# Patient Record
Sex: Female | Born: 1980 | Race: White | Hispanic: No | Marital: Married | Smoking: Never smoker
Health system: Southern US, Community
[De-identification: ages and names within clinical notes are randomized; demographics above are authoritative.]

## PROBLEM LIST (undated history)

## (undated) DIAGNOSIS — N809 Endometriosis, unspecified: Secondary | ICD-10-CM

## (undated) HISTORY — PX: CYST EXCISION: SHX5701

## (undated) HISTORY — PX: CHOLECYSTECTOMY: SHX55

---
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
## 2006-02-15 ENCOUNTER — Other Ambulatory Visit: Admission: RE | Admit: 2006-02-15 | Discharge: 2006-02-15 | Payer: Self-pay | Admitting: Obstetrics and Gynecology

## 2009-03-18 ENCOUNTER — Ambulatory Visit (HOSPITAL_COMMUNITY): Admission: RE | Admit: 2009-03-18 | Discharge: 2009-03-18 | Payer: Self-pay | Admitting: Obstetrics and Gynecology

## 2009-03-18 ENCOUNTER — Encounter (INDEPENDENT_AMBULATORY_CARE_PROVIDER_SITE_OTHER): Payer: Self-pay | Admitting: Obstetrics and Gynecology

## 2011-03-29 LAB — CBC
MCHC: 33.9 g/dL (ref 30.0–36.0)
RDW: 12.8 % (ref 11.5–15.5)

## 2011-05-02 NOTE — Op Note (Signed)
Latasha Warren, OSTENSON              ACCOUNT NO.:  [id number]   MEDICAL RECORD NO.:  [id number]          PATIENT TYPE:  AMB   LOCATION:  SDC                           FACILITY:  [facility]   PHYSICIAN:  Huel Cote, M.D. DATE OF BIRTH:  [birth year]   DATE OF PROCEDURE:  03/18/2009  DATE OF DISCHARGE:                               OPERATIVE REPORT   PREOPERATIVE DIAGNOSES:  1. Menorrhagia.  2. Dysmenorrhea.  3. Pelvic pain.  4. Left ovarian cyst.   POSTOPERATIVE DIAGNOSES:  1. Menorrhagia.  2. Dysmenorrhea.  3. Pelvic pain.  4. Left ovarian cyst.  5. Left endometrioma.   PROCEDURES:  Laparoscopy, left ovarian cystectomy, hysteroscopy,  dilation and curettage, and Mirena placement.   SURGEON:  Huel Cote, MD   ANESTHESIA:  General.   FINDINGS:  There was a left endometrioma, which was removed and drained  measuring approximately 2-3 cm and filled with dark chocolate-colored  fluid on the posterior surface of the left ovary.  The right ovary and  tube appeared completely within normal limits.  The left tube appeared  normal.  There is a small amount of scattered endometriosis in the  pelvis otherwise, but no significant other foci which needed ablation.  Uterus appeared normal.  On hysteroscopy, the cavity itself was normal  except for a thick endometrium.   SPECIMENS:  Endometrial curettings were sent to Pathology.   FINDINGS:  Pending on that.   ESTIMATED BLOOD LOSS:  50 mL.   URINE OUTPUT:  75 mL straight cath prior to procedure.   INTRAVENOUS FLUIDS:  2000 mL LR.   PROCEDURE:  The patient was taken to operating room where general  anesthesia was obtained without difficulty.  She was then prepped and  draped in normal sterile fashion in dorsal lithotomy position.  Attention was turned to the vagina and a Hulka tenaculum placed within  the cervix without difficulty.  The speculum was then removed.  The  bladder emptied with a red rubber and then gowned in a new  set of gown  and gloves.  Attention was turned to an infraumbilical area were small  preexisting scar was incised with the scalpel approximately 2 cm in  width.  This was then elevated and the Veress needle easily introduced  into the abdominal cavity.  Intraperitoneal placement was confirmed by  aspiration injection with normal saline and pneumo-gas was applied and a  pressure of 5 noted to be normal.  Pneumoperitoneum was then obtained  with approximately 2.5-3 liters CO2 gas.  Once this was obtained, Veress  needle was removed and the 10/11 trocar OptiVu was utilized to enter  with direct visualization with the camera in place.  The peritoneal  cavity was entered and the findings as noted previously were observed.  Two additional trocars were placed 5-mm in size in the upper right and  left quadrant under direct visualization.  Each site was injected with  0.25% Marcaine plain prior to insertion of these trocars in place.  The  pelvis and abdomen were carefully inspected.  The primary finding was  the left ovarian cyst, which looked to  measure approximately 2-3 cm and  did appear to be an endometrioma.  There were some staining of the  anterior cul-de-sac, which was likely from a previously draining  endometrioma.  The left ovary was grasped with a million-dollar grasper  and elevated.  The endometrium was unfortunately posterior, which made  it a little difficult to reach.  There was a small amount of bleeding  coming from the parent hemorrhagic cyst on the ovary itself.  The  endometrioma was opened with the harmonic scalpel and a moderate amount  of chocolate-colored fluid was drained into the cul-de-sac.  The  remaining cyst wall was then cauterized with the harmonic scalpel to  remove as much as was possible.  The cyst did appear to completely  deflate and most of the cyst wall was cauterized.  The ovary itself  appeared normal except for the bleeding area from a probable  previously  hemorrhagic corpus luteum cyst.  This was controlled with the harmonic  scalpel for good hemostasis.  Once this was achieved, no active bleeding  was observed.  The abdomen and pelvis were copiously irrigated and all  of the endometrioma fluid and blood was removed from the cul-de-sac.  There was absolutely no active bleeding noted.  The right ovary and tube  as stated appeared normal.  The left tube actually appeared normal at  its fimbriated end.  Although initially, it had been slightly adherent  to the ovary, this was taken down.  The uterus appeared normal.  The  appendix appeared normal.  Thus with the left endometrioma removed and  all appearing hemostatic, the 5-mm trocars were removed under direct  visualization and the 10/11 was removed from the umbilicus after  evacuation of the pneumoperitoneum.  All sponge and instrument counts  were correct.  The trocar incisions were then closed with 1 deep suture  of 0 Vicryl at the umbilical port and a 3-0 Vicryl subcuticular stitch.  The 5-mm ports were controlled with a 3-0 Vicryl subcuticular stitch and  Dermabond on all 3 as well.  All appeared hemostatic.  Therefore,  attention was then turned vaginally.  The Hulka tenaculum had come  undone during the laparoscopic procedure, so was already removed from  the vagina.  The speculum was reinserted.  The cervix identified and  grasped on the anterior lip after injection with 1% plain lidocaine with  a single-tooth tenaculum.  An additional paracervical block was  performed with approximately 20 mL of 1% lidocaine at the 2 and 10  o'clock position.  The uterus was easily sounded to approximately 7-8 cm  and sequential dilation was easily performed due to her prior Cytotec  placement.  With this performed, the camera was then introduced into the  uterine cavity.  It appeared normal with no submucosal fibroids or  polyps noted.  However, there was abundant thick endometrium  noted.  At  this point, the camera was removed and a curettage performed with  abundant endometrium removed and sent to Pathology.  Once this was  completed, the hysteroscope was reintroduced into the cavity which  appeared normal and was slightly more easy to visualize with the  thickened endometrium removed.  There was no active bleeding noted.  Therefore, the hysteroscope was removed and the Mirena IUD placed  without difficulty.  Strings were trimmed to about 2-3 cm and then the  tenaculum was removed from the cervix.  There was a small area of  bleeding at the tenaculum site which was  treated with silver nitrate and  this was hemostatic at the conclusion of the case.  Again, all sponge,  lap, and needle counts were correct x2 and the patient was awakened and  taken to the recovery room in good condition.      Huel Cote, M.D.  Electronically Signed    KR/MEDQ  D:  03/18/2009  T:  03/18/2009  Job:  [id number]

## 2011-05-02 NOTE — H&P (Signed)
Latasha Warren, Latasha Warren              ACCOUNT NO.:  [id number]   MEDICAL RECORD NO.:  [id number]          PATIENT TYPE:  AMB   LOCATION:  SDC                           FACILITY:  [facility]   PHYSICIAN:  Huel Cote, M.D. DATE OF BIRTH:  [birth year]   DATE OF ADMISSION:  DATE OF DISCHARGE:                              HISTORY & PHYSICAL   The patient is a 30 year old nulligravida female who is coming in for a  scheduled laparoscopy and hysteroscopy given an ongoing problem with  abnormal uterine bleeding and a persistent ovarian cyst on the left  which measures approximately 2-3 cm and could be an endometrioma versus  a dermoid.  The patient complains of bleeding approximately 2 weeks out  of 4 and also complains of terrible pain with her cycles, about 50% of  the time with heavy bleeding.  She also reports no sexual activity with  her husband in greater than 3 years secondary to the pain with her  cycles and decrease in enjoyment associated with these issues.  She had  a ultrasound performed to evaluate her endometrium and at that point was  also noted to have the left ovarian cyst which was 2 cm and 3 cm of  size.  This had persisted over approximately 3 months of scanning on the  followup.   PAST MEDICAL HISTORY:  Insignificant.   PAST SURGICAL HISTORY:  Significant for cholecystectomy only.   PAST OBSTETRICAL HISTORY:  None.   PAST GYN HISTORY:  No abnormal Pap smears.   MEDICATIONS:  She currently takes no medications.   ALLERGIES:  ERYTHROMYCIN only.   PHYSICAL EXAMINATION:  VITAL SIGNS:  The patient's height is 5 feet 7.  Her weight is 133.  Blood pressure 120/76.  CARDIAC:  Regular rate and rhythm.  LUNGS:  Clear.  ABDOMEN:  Soft and nontender.  PELVIC:  She has a normal vagina externally.  Cervix has no lesions.  Uterus is normal in size and contour and the adnexa have no palpable  masses felt bilaterally.   We had discussed all possible options and given her  persistent abnormal  bleeding, really felt that she should have endometrial sampling.  This  had been attempted in the past and unable to do secondary to her  stenotic cervix.  We also discussed the persistence of the left ovary  with a possible endometrioma or dermoid and, given these issues, the  patient wished to proceed with definitive surgery.  We therefore  discussed laparoscopy in detail as well as hysteroscopy in detail.  The  risks of laparoscopy were addressed including bleeding, infection and  possible damage to adjacent organs including bowel or bladder.  She  understands she would need a larger abdominal incision should any of  these complications arise and have a delay in recovery.  We also  discussed hysteroscopy in detail with a risk of uterine perforation.  We  will perform endometrial sampling at the time and also have placement of  a Mirena IUD to thin her lining and decrease her cycles in the long run.  She understands she will have abnormal bleeding  in the beginning with  the Mirena in place; however, this will hopefully abate as months go on  and she is also glad to have  this as contraception.  Therefore after all risks and benefits were  discussed with the patient in detail, we decided to proceed with her D  and C, hysteroscopy and laparoscopy and Mirena placement.  She will use  Cytotec 3 hours prior to procedure to aid with cervical dilatation given  her history of a stenotic cervix.      Huel Cote, M.D.  Electronically Signed     KR/MEDQ  D:  03/17/2009  T:  03/17/2009  Job:  [id number]

## 2020-09-28 ENCOUNTER — Inpatient Hospital Stay (HOSPITAL_COMMUNITY): Payer: No Typology Code available for payment source

## 2020-09-28 ENCOUNTER — Encounter (HOSPITAL_COMMUNITY): Payer: Self-pay

## 2020-09-28 ENCOUNTER — Other Ambulatory Visit: Payer: Self-pay

## 2020-09-28 ENCOUNTER — Inpatient Hospital Stay (HOSPITAL_COMMUNITY)
Admission: EM | Admit: 2020-09-28 | Discharge: 2020-09-30 | DRG: 494 | Disposition: A | Discharge status: Home / Self Care | Payer: No Typology Code available for payment source | Attending: Student | Admitting: Student

## 2020-09-28 ENCOUNTER — Emergency Department (HOSPITAL_COMMUNITY): Payer: No Typology Code available for payment source

## 2020-09-28 DIAGNOSIS — T148XXA Other injury of unspecified body region, initial encounter: Secondary | ICD-10-CM

## 2020-09-28 DIAGNOSIS — S82252A Displaced comminuted fracture of shaft of left tibia, initial encounter for closed fracture: Principal | ICD-10-CM

## 2020-09-28 DIAGNOSIS — Z419 Encounter for procedure for purposes other than remedying health state, unspecified: Secondary | ICD-10-CM

## 2020-09-28 DIAGNOSIS — S82872A Displaced pilon fracture of left tibia, initial encounter for closed fracture: Secondary | ICD-10-CM

## 2020-09-28 DIAGNOSIS — S82202A Unspecified fracture of shaft of left tibia, initial encounter for closed fracture: Secondary | ICD-10-CM | POA: Diagnosis present

## 2020-09-28 DIAGNOSIS — S82832A Other fracture of upper and lower end of left fibula, initial encounter for closed fracture: Secondary | ICD-10-CM

## 2020-09-28 DIAGNOSIS — W010XXA Fall on same level from slipping, tripping and stumbling without subsequent striking against object, initial encounter: Secondary | ICD-10-CM | POA: Diagnosis present

## 2020-09-28 DIAGNOSIS — Z881 Allergy status to other antibiotic agents status: Secondary | ICD-10-CM | POA: Diagnosis not present

## 2020-09-28 DIAGNOSIS — Y92219 Unspecified school as the place of occurrence of the external cause: Secondary | ICD-10-CM

## 2020-09-28 DIAGNOSIS — Z20822 Contact with and (suspected) exposure to covid-19: Secondary | ICD-10-CM | POA: Diagnosis present

## 2020-09-28 HISTORY — DX: Endometriosis, unspecified: N80.9

## 2020-09-28 LAB — CBC WITH DIFFERENTIAL/PLATELET
Abs Immature Granulocytes: 0.05 10*3/uL (ref 0.00–0.07)
Basophils Absolute: 0 10*3/uL (ref 0.0–0.1)
Basophils Relative: 1 %
Eosinophils Absolute: 0.1 10*3/uL (ref 0.0–0.5)
Eosinophils Relative: 1 %
HCT: 39.2 % (ref 36.0–46.0)
Hemoglobin: 12.3 g/dL (ref 12.0–15.0)
Immature Granulocytes: 1 %
Lymphocytes Relative: 15 %
Lymphs Abs: 1.3 10*3/uL (ref 0.7–4.0)
MCH: 29.2 pg (ref 26.0–34.0)
MCHC: 31.4 g/dL (ref 30.0–36.0)
MCV: 93.1 fL (ref 80.0–100.0)
Monocytes Absolute: 0.6 10*3/uL (ref 0.1–1.0)
Monocytes Relative: 6 %
Neutro Abs: 6.8 10*3/uL (ref 1.7–7.7)
Neutrophils Relative %: 76 %
Platelets: 275 10*3/uL (ref 150–400)
RBC: 4.21 MIL/uL (ref 3.87–5.11)
RDW: 12.8 % (ref 11.5–15.5)
WBC: 8.8 10*3/uL (ref 4.0–10.5)
nRBC: 0 % (ref 0.0–0.2)

## 2020-09-28 LAB — COMPREHENSIVE METABOLIC PANEL
ALT: 16 U/L (ref 0–44)
AST: 24 U/L (ref 15–41)
Albumin: 4 g/dL (ref 3.5–5.0)
Alkaline Phosphatase: 35 U/L — ABNORMAL LOW (ref 38–126)
Anion gap: 11 (ref 5–15)
BUN: 12 mg/dL (ref 6–20)
CO2: 23 mmol/L (ref 22–32)
Calcium: 8.8 mg/dL — ABNORMAL LOW (ref 8.9–10.3)
Chloride: 103 mmol/L (ref 98–111)
Creatinine, Ser: 0.84 mg/dL (ref 0.44–1.00)
GFR, Estimated: >60 mL/min (ref >60)
Glucose, Bld: 128 mg/dL — ABNORMAL HIGH (ref 70–99)
Potassium: 3.6 mmol/L (ref 3.5–5.1)
Sodium: 137 mmol/L (ref 135–145)
Total Bilirubin: 0.6 mg/dL (ref 0.3–1.2)
Total Protein: 6.5 g/dL (ref 6.5–8.1)

## 2020-09-28 LAB — RESPIRATORY PANEL BY RT PCR (FLU A&B, COVID)
Influenza A by PCR: NEGATIVE
Influenza B by PCR: NEGATIVE
SARS Coronavirus 2 by RT PCR: NEGATIVE

## 2020-09-28 MED ORDER — ONDANSETRON HCL 4 MG/2ML IJ SOLN: 4.0000 mg | Freq: Four times a day (QID) | INTRAMUSCULAR | Status: DC | PRN

## 2020-09-28 MED ORDER — MORPHINE SULFATE (PF) 2 MG/ML IV SOLN
2.0000 mg | INTRAVENOUS | Status: DC | PRN
  Administered 2020-09-28: 2 mg via INTRAVENOUS
  Filled 2020-09-28: qty 1

## 2020-09-28 MED ORDER — MORPHINE SULFATE (PF) 4 MG/ML IV SOLN
4.0000 mg | Freq: Once | INTRAVENOUS | Status: AC
  Administered 2020-09-28: 4 mg via INTRAVENOUS
  Filled 2020-09-28: qty 1

## 2020-09-28 MED ORDER — ENOXAPARIN SODIUM 40 MG/0.4ML ~~LOC~~ SOLN: 40.0000 mg | SUBCUTANEOUS | Status: DC

## 2020-09-28 MED ORDER — OXYCODONE HCL 5 MG PO TABS
5.0000 mg | ORAL_TABLET | ORAL | Status: DC | PRN
  Administered 2020-09-28 – 2020-09-29 (×3): 15 mg via ORAL
  Administered 2020-09-29: 10 mg via ORAL
  Administered 2020-09-30: 15 mg via ORAL
  Administered 2020-09-30: 10 mg via ORAL
  Filled 2020-09-28: qty 3
  Filled 2020-09-28: qty 2
  Filled 2020-09-28 (×2): qty 3
  Filled 2020-09-28: qty 2
  Filled 2020-09-28: qty 3

## 2020-09-28 MED ORDER — METHOCARBAMOL 500 MG PO TABS
500.0000 mg | ORAL_TABLET | Freq: Four times a day (QID) | ORAL | Status: DC | PRN
  Administered 2020-09-28 – 2020-09-30 (×5): 500 mg via ORAL
  Filled 2020-09-28 (×4): qty 1

## 2020-09-28 MED ORDER — HYDROMORPHONE HCL 1 MG/ML IJ SOLN
0.5000 mg | Freq: Once | INTRAMUSCULAR | Status: AC
  Administered 2020-09-28: 0.5 mg via INTRAVENOUS
  Filled 2020-09-28: qty 1

## 2020-09-28 MED ORDER — ONDANSETRON HCL 4 MG PO TABS: 4.0000 mg | ORAL_TABLET | Freq: Four times a day (QID) | ORAL | Status: DC | PRN

## 2020-09-28 MED ORDER — METHOCARBAMOL 1000 MG/10ML IJ SOLN
500.0000 mg | Freq: Four times a day (QID) | INTRAVENOUS | Status: DC | PRN
  Filled 2020-09-28 (×3): qty 5

## 2020-09-28 NOTE — ED Provider Notes (Signed)
[facility] EMERGENCY DEPARTMENT Provider Note   CSN: [id number] Arrival date & time: 09/28/20  1359     History Chief Complaint  Patient presents with  . Leg Injury    Latasha Warren is a 39 y.o. female.  HPI Patient is a 39 year old female with no pertinent past medical history other than elective cholecystectomy several years ago.  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  She states that she had severe onset of 10/10 pain that is achy severe radiating into her lower shin.  She denies any other areas of pain.  Denies any buttocks, hip or back pain.  She denies any head injury or loss of consciousness.  She states she otherwise feels well.  Denies any lightheadedness dizziness, chest pain abdominal pain, headache.  States pain is intimately worse with movement and touch.  She states she can feel well in her toes however and is able to move them.  She is been holding still for the most part.  No other aggravating or mitigating factors.  She is taken nothing for pain prior to arrival in the ER.     History reviewed. No pertinent past medical history.  There are no problems to display for this patient.   Past Surgical History:  Procedure Laterality Date  . CHOLECYSTECTOMY    . CYST EXCISION       OB History   No obstetric history on file.     History reviewed. No pertinent family history.  Social History   Tobacco Use  . Smoking status: Never Smoker  Substance Use Topics  . Alcohol use: Yes    Comment: beer and whiskey on weekends.   . Drug use: Never    Home Medications Prior to Admission medications   Not on File    Allergies    Erythromycin  Review of Systems   Review of Systems  Constitutional: Negative for fever.  HENT: Negative for congestion.   Respiratory: Negative for shortness of breath.     Cardiovascular: Negative for chest pain.  Gastrointestinal: Negative for abdominal distention.  Musculoskeletal:       Left lower leg pain  Neurological: Negative for dizziness and headaches.    Physical Exam Updated Vital Signs BP (!) 146/100   Pulse 92   Temp 98.6 F (37 C) (Oral)   Resp 18   LMP 08/29/2020   SpO2 100%   Physical Exam Vitals and nursing note reviewed.  Constitutional:      General: She is in acute distress (Uncomfortable).     Appearance: Normal appearance. She is not ill-appearing.     Comments: Uncomfortable  HENT:     Head: Normocephalic and atraumatic.     Mouth/Throat:     Mouth: Mucous membranes are moist.  Eyes:     General: No scleral icterus.       Right eye: No discharge.        Left eye: No discharge.     Conjunctiva/sclera: Conjunctivae normal.  Pulmonary:     Effort: Pulmonary effort is normal.     Breath sounds: No stridor.  Abdominal:     Tenderness: There is no abdominal tenderness.  Musculoskeletal:     Comments: Significant deformity of the left mid shin with swelling no significant bruising no lacerations or deep cuts.  No significant abrasions either.  There is significant tenderness with light touch to the midshin.  Some tenderness of the lateral malleolus as well.  Able to wiggle toes.  No tenderness to palpation of the foot, metatarsals, toes, phalanxes, heel.  No hip or pelvic tenderness to palpation no abdominal tenderness.  No midline back tenderness to palpation.  Skin:    General: Skin is warm and dry.     Capillary Refill: Capillary refill takes less than 2 seconds. Cap refill in all toes of left foot less than 2 seconds Neurological:     Mental Status: She is alert and oriented to person, place, and time. Mental status is at baseline.     Comments: Sensation intact in all toes in all aspects of the leg.  Psychiatric:        Mood and Affect: Mood normal.        Behavior: Behavior normal.     ED Results /  Procedures / Treatments   Labs (all labs ordered are listed, but only abnormal results are displayed) Labs Reviewed  RESPIRATORY PANEL BY RT PCR (FLU A&B, COVID)  CBC WITH DIFFERENTIAL/PLATELET  COMPREHENSIVE METABOLIC PANEL    EKG None  Radiology DG Tibia/Fibula Left  Result Date: 09/28/2020 CLINICAL DATA:  Fall, leg pain and swelling EXAM: LEFT TIBIA AND FIBULA - 2 VIEW COMPARISON:  None. FINDINGS: Acute minimally comminuted fracture of the mid to distal left tibial diaphysis. Slight posterior displacement and anterior apex angulation. Comminuted predominantly obliquely oriented fracture of the distal fibular metaphysis with approximately 7 mm of posterior displacement. There is soft tissue swelling at the fracture sites. Alignment at the knee and ankle appear anatomic. IMPRESSION: Acute comminuted mildly displaced fractures of the distal left tibial diaphysis and distal fibular metaphysis. Electronically Signed   By: Duanne Guess D.O.   On: 09/28/2020 14:58    Procedures Procedures (including critical care time)  Medications Ordered in ED Medications  morphine 4 MG/ML injection 4 mg (4 mg Intravenous Given 09/28/20 1527)    ED Course  I have reviewed the triage vital signs and the nursing notes.  Pertinent labs & imaging results that were available during my care of the patient were reviewed by me and considered in my medical decision making (see chart for details).  Clinical Course as of Sep 28 1552  Tue Sep 28, 2020  1548 Patient assessed by PA Lin Givens of orthopedic surgery.  Will be admitted to orthopedic service.  I placed orders for basic lab work and Covid swab.   [WF]    Clinical Course User Index [WF] Gailen Shelter, [place]   MDM Rules/Calculators/A&P                          Patient is 39 year old healthy female with no pertinent past medical history and on no medications presented today with left leg pain found to have a tib-fib fracture.  She has good  pulses sensation and good cap refill distally.  She does have significant tenderness to palpation consistent for fractures low suspicion the patient will develop compartment syndrome.  She is in significant pain but is handling pain well.  We will provide patient with morphine and discussed with orthopedics to determine whether she will need admission for surgery.  Patient mated to orthopedic service.  Basic labs and Covid swab obtained for anesthesia prior to surgery.  Surgery will occur tomorrow per [facility].  Final Clinical Impression(s) / ED Diagnoses Final diagnoses:  Closed displaced comminuted  fracture of shaft of left tibia, initial encounter  Closed fracture of distal end of left fibula, unspecified fracture morphology, initial encounter    Rx / DC Orders ED Discharge Orders    None       Gailen Shelter, [place] 09/28/20 1554    Vanetta Mulders, MD 09/30/20 [phone]

## 2020-09-28 NOTE — Plan of Care (Signed)

## 2020-09-28 NOTE — H&P (View-Only) (Signed)
Reason for Consult:Left tibia fx Referring Physician: Darlyn Chamber  Latasha Warren is an 39 y.o. female.  HPI: Latasha Warren was walking outside at school and slipped on something. Latasha Warren fell backwards and hurt Latasha Warren leg. Latasha Warren could not bear weight on it. Latasha Warren was brought to the ED where x-rays showed a tib/fib fx and orthopedic surgery was consulted. Latasha Warren works as a Runner, broadcasting/film/video.  History reviewed. No pertinent past medical history.  Past Surgical History:  Procedure Laterality Date  . CHOLECYSTECTOMY    . CYST EXCISION      History reviewed. No pertinent family history.  Social History:  reports that Latasha Warren has never smoked. Latasha Warren does not have any smokeless tobacco history on file. Latasha Warren reports current alcohol use. Latasha Warren reports that Latasha Warren does not use drugs.  Allergies:  Allergies  Allergen Reactions  . Erythromycin Hives    Medications: I have reviewed the patient's current medications.  No results found for this or any previous visit (from the past 48 hour(s)).  DG Tibia/Fibula Left  Result Date: 09/28/2020 CLINICAL DATA:  Fall, leg pain and swelling EXAM: LEFT TIBIA AND FIBULA - 2 VIEW COMPARISON:  None. FINDINGS: Acute minimally comminuted fracture of the mid to distal left tibial diaphysis. Slight posterior displacement and anterior apex angulation. Comminuted predominantly obliquely oriented fracture of the distal fibular metaphysis with approximately 7 mm of posterior displacement. There is soft tissue swelling at the fracture sites. Alignment at the knee and ankle appear anatomic. IMPRESSION: Acute comminuted mildly displaced fractures of the distal left tibial diaphysis and distal fibular metaphysis. Electronically Signed   By: Duanne Guess D.O.   On: 09/28/2020 14:58    Review of Systems  HENT: Negative for ear discharge, ear pain, hearing loss and tinnitus.   Eyes: Negative for photophobia and pain.  Respiratory: Negative for cough and shortness of breath.   Cardiovascular: Negative for  chest pain.  Gastrointestinal: Negative for abdominal pain, nausea and vomiting.  Genitourinary: Negative for dysuria, flank pain, frequency and urgency.  Musculoskeletal: Positive for arthralgias (Left leg). Negative for back pain, myalgias and neck pain.  Neurological: Negative for dizziness and headaches.  Hematological: Does not bruise/bleed easily.  Psychiatric/Behavioral: The patient is not nervous/anxious.    Blood pressure (!) 146/100, pulse 92, temperature 98.6 F (37 C), temperature source Oral, resp. rate 18, last menstrual period 08/29/2020, SpO2 100 %. Physical Exam Constitutional:      General: Latasha Warren is not in acute distress.    Appearance: Latasha Warren is well-developed. Latasha Warren is not diaphoretic.  HENT:     Head: Normocephalic and atraumatic.  Eyes:     General: No scleral icterus.       Right eye: No discharge.        Left eye: No discharge.     Conjunctiva/sclera: Conjunctivae normal.  Cardiovascular:     Rate and Rhythm: Normal rate and regular rhythm.  Pulmonary:     Effort: Pulmonary effort is normal. No respiratory distress.  Musculoskeletal:     Cervical back: Normal range of motion.     Comments: LLE No traumatic wounds or rash  TTP lower leg/ankle  No knee effusion  Knee stable to varus/ valgus and anterior/posterior stress  Sens DPN, SPN, TN intact  Motor EHL, ext, flex, evers 5/5  DP 1+, PT 1+, No significant edema  Skin:    General: Skin is warm and dry.  Neurological:     Mental Status: Latasha Warren is alert.  Psychiatric:  Behavior: Behavior normal.     Assessment/Plan: Left tib/fib fx -- Plan IMN, EUA tomorrow by Dr. Haddix. NPO after MN.    Cassundra Mckeever J. Joliene Salvador, PA-C Orthopedic Surgery [phone] 09/28/2020, 3:47 PM  

## 2020-09-28 NOTE — ED Triage Notes (Signed)
To triage via EMS.  Pt works at school, was walking outside during Loss adjuster, chartered, slipped on acorn, fell on buttocks.  Obvious deformity to left lower leg in two places.   + pedal pulses.

## 2020-09-28 NOTE — Progress Notes (Signed)
Orthopedic Tech Progress Note Patient Details:  Latasha Warren [birth year] [id number] Applied a SHORT LEG with STIRRUPS. Splint could have been a little better but patient was in a lot of discomfort with the bones moving around.Marland Kitchen after applying splint myself and others helped patient on stretcher  Ortho Devices Type of Ortho Device: Stirrup splint, Short leg splint Ortho Device/Splint Location: LLE Ortho Device/Splint Interventions: Ordered, Application   Post Interventions Patient Tolerated: Fair, Well Instructions Provided: Care of device   Donald Pore 09/28/2020, 5:39 PM

## 2020-09-28 NOTE — Consult Note (Signed)
Reason for Consult:Left tibia fx Referring Physician: Darlyn Chamber  Latasha Warren is an 39 y.o. female.  HPI: Latasha Warren was walking outside at school and slipped on something. She fell backwards and hurt her leg. She could not bear weight on it. She was brought to the ED where x-rays showed a tib/fib fx and orthopedic surgery was consulted. She works as a Runner, broadcasting/film/video.  History reviewed. No pertinent past medical history.  Past Surgical History:  Procedure Laterality Date  . CHOLECYSTECTOMY    . CYST EXCISION      History reviewed. No pertinent family history.  Social History:  reports that she has never smoked. She does not have any smokeless tobacco history on file. She reports current alcohol use. She reports that she does not use drugs.  Allergies:  Allergies  Allergen Reactions  . Erythromycin Hives    Medications: I have reviewed the patient's current medications.  No results found for this or any previous visit (from the past 48 hour(s)).  DG Tibia/Fibula Left  Result Date: 09/28/2020 CLINICAL DATA:  Fall, leg pain and swelling EXAM: LEFT TIBIA AND FIBULA - 2 VIEW COMPARISON:  None. FINDINGS: Acute minimally comminuted fracture of the mid to distal left tibial diaphysis. Slight posterior displacement and anterior apex angulation. Comminuted predominantly obliquely oriented fracture of the distal fibular metaphysis with approximately 7 mm of posterior displacement. There is soft tissue swelling at the fracture sites. Alignment at the knee and ankle appear anatomic. IMPRESSION: Acute comminuted mildly displaced fractures of the distal left tibial diaphysis and distal fibular metaphysis. Electronically Signed   By: Duanne Guess D.O.   On: 09/28/2020 14:58    Review of Systems  HENT: Negative for ear discharge, ear pain, hearing loss and tinnitus.   Eyes: Negative for photophobia and pain.  Respiratory: Negative for cough and shortness of breath.   Cardiovascular: Negative for  chest pain.  Gastrointestinal: Negative for abdominal pain, nausea and vomiting.  Genitourinary: Negative for dysuria, flank pain, frequency and urgency.  Musculoskeletal: Positive for arthralgias (Left leg). Negative for back pain, myalgias and neck pain.  Neurological: Negative for dizziness and headaches.  Hematological: Does not bruise/bleed easily.  Psychiatric/Behavioral: The patient is not nervous/anxious.    Blood pressure (!) 146/100, pulse 92, temperature 98.6 F (37 C), temperature source Oral, resp. rate 18, last menstrual period 08/29/2020, SpO2 100 %. Physical Exam Constitutional:      General: She is not in acute distress.    Appearance: She is well-developed. She is not diaphoretic.  HENT:     Head: Normocephalic and atraumatic.  Eyes:     General: No scleral icterus.       Right eye: No discharge.        Left eye: No discharge.     Conjunctiva/sclera: Conjunctivae normal.  Cardiovascular:     Rate and Rhythm: Normal rate and regular rhythm.  Pulmonary:     Effort: Pulmonary effort is normal. No respiratory distress.  Musculoskeletal:     Cervical back: Normal range of motion.     Comments: LLE No traumatic wounds or rash  TTP lower leg/ankle  No knee effusion  Knee stable to varus/ valgus and anterior/posterior stress  Sens DPN, SPN, TN intact  Motor EHL, ext, flex, evers 5/5  DP 1+, PT 1+, No significant edema  Skin:    General: Skin is warm and dry.  Neurological:     Mental Status: She is alert.  Psychiatric:  Behavior: Behavior normal.     Assessment/Plan: Left tib/fib fx -- Plan IMN, EUA tomorrow by Dr. Jena Gauss. NPO after MN.    Freeman Caldron, PA-C Orthopedic Surgery [phone] 09/28/2020, 3:47 PM

## 2020-09-29 ENCOUNTER — Inpatient Hospital Stay (HOSPITAL_COMMUNITY): Payer: No Typology Code available for payment source | Admitting: Anesthesiology

## 2020-09-29 ENCOUNTER — Inpatient Hospital Stay (HOSPITAL_COMMUNITY): Payer: No Typology Code available for payment source

## 2020-09-29 ENCOUNTER — Encounter (HOSPITAL_COMMUNITY)
Admission: EM | Disposition: A | Discharge status: Home / Self Care | Payer: Self-pay | Source: Home / Self Care | Attending: Student

## 2020-09-29 HISTORY — PX: TIBIA IM NAIL INSERTION: SHX2516

## 2020-09-29 LAB — SURGICAL PCR SCREEN
MRSA, PCR: NEGATIVE
Staphylococcus aureus: NEGATIVE

## 2020-09-29 LAB — VITAMIN D 25 HYDROXY (VIT D DEFICIENCY, FRACTURES): Vit D, 25-Hydroxy: 46.1 ng/mL (ref 30–100)

## 2020-09-29 LAB — HIV ANTIBODY (ROUTINE TESTING W REFLEX): HIV Screen 4th Generation wRfx: NONREACTIVE

## 2020-09-29 SURGERY — INSERTION, INTRAMEDULLARY ROD, TIBIA
Anesthesia: General | Laterality: Left

## 2020-09-29 MED ORDER — MIDAZOLAM HCL 2 MG/2ML IJ SOLN
INTRAMUSCULAR | Status: AC
  Filled 2020-09-29: qty 2

## 2020-09-29 MED ORDER — POLYETHYLENE GLYCOL 3350 17 G PO PACK: 17.0000 g | PACK | Freq: Every day | ORAL | Status: DC | PRN

## 2020-09-29 MED ORDER — DEXMEDETOMIDINE (PRECEDEX) IN NS 20 MCG/5ML (4 MCG/ML) IV SYRINGE
PREFILLED_SYRINGE | INTRAVENOUS | Status: DC | PRN
  Administered 2020-09-29: 8 ug via INTRAVENOUS
  Administered 2020-09-29: 4 ug via INTRAVENOUS
  Administered 2020-09-29: 8 ug via INTRAVENOUS

## 2020-09-29 MED ORDER — FENTANYL CITRATE (PF) 250 MCG/5ML IJ SOLN
INTRAMUSCULAR | Status: AC
  Filled 2020-09-29: qty 5

## 2020-09-29 MED ORDER — PROPOFOL 10 MG/ML IV BOLUS
INTRAVENOUS | Status: AC
  Filled 2020-09-29: qty 40

## 2020-09-29 MED ORDER — FENTANYL CITRATE (PF) 100 MCG/2ML IJ SOLN
INTRAMUSCULAR | Status: AC
  Filled 2020-09-29: qty 2

## 2020-09-29 MED ORDER — ROCURONIUM BROMIDE 10 MG/ML (PF) SYRINGE
PREFILLED_SYRINGE | INTRAVENOUS | Status: DC | PRN
  Administered 2020-09-29: 50 mg via INTRAVENOUS

## 2020-09-29 MED ORDER — ONDANSETRON HCL 4 MG/2ML IJ SOLN: 4.0000 mg | Freq: Four times a day (QID) | INTRAMUSCULAR | Status: DC | PRN

## 2020-09-29 MED ORDER — HYDROMORPHONE HCL 1 MG/ML IJ SOLN
INTRAMUSCULAR | Status: DC | PRN
  Administered 2020-09-29 (×2): .5 mg via INTRAVENOUS

## 2020-09-29 MED ORDER — HYDROMORPHONE HCL 1 MG/ML IJ SOLN
INTRAMUSCULAR | Status: AC
  Filled 2020-09-29: qty 0.5

## 2020-09-29 MED ORDER — POTASSIUM CHLORIDE IN NACL 20-0.9 MEQ/L-% IV SOLN
INTRAVENOUS | Status: DC
  Filled 2020-09-29: qty 1000

## 2020-09-29 MED ORDER — LIDOCAINE 2% (20 MG/ML) 5 ML SYRINGE
INTRAMUSCULAR | Status: AC
  Filled 2020-09-29: qty 10

## 2020-09-29 MED ORDER — DEXMEDETOMIDINE HCL IN NACL 80 MCG/20ML IV SOLN
INTRAVENOUS | Status: AC
  Filled 2020-09-29: qty 20

## 2020-09-29 MED ORDER — ACETAMINOPHEN 325 MG PO TABS
650.0000 mg | ORAL_TABLET | Freq: Four times a day (QID) | ORAL | Status: DC
  Administered 2020-09-29 – 2020-09-30 (×6): 650 mg via ORAL
  Filled 2020-09-29 (×6): qty 2

## 2020-09-29 MED ORDER — DEXMEDETOMIDINE HCL 200 MCG/2ML IV SOLN
40.0000 ug | Freq: Once | INTRAVENOUS | Status: AC
  Administered 2020-09-29: 40 ug via INTRAVENOUS

## 2020-09-29 MED ORDER — LIDOCAINE 2% (20 MG/ML) 5 ML SYRINGE
INTRAMUSCULAR | Status: DC | PRN
  Administered 2020-09-29: 40 mg via INTRAVENOUS

## 2020-09-29 MED ORDER — DOCUSATE SODIUM 100 MG PO CAPS
100.0000 mg | ORAL_CAPSULE | Freq: Two times a day (BID) | ORAL | Status: DC
  Administered 2020-09-29 – 2020-09-30 (×3): 100 mg via ORAL
  Filled 2020-09-29 (×3): qty 1

## 2020-09-29 MED ORDER — VANCOMYCIN HCL 1000 MG IV SOLR
INTRAVENOUS | Status: AC
  Filled 2020-09-29: qty 1000

## 2020-09-29 MED ORDER — FENTANYL CITRATE (PF) 100 MCG/2ML IJ SOLN
25.0000 ug | INTRAMUSCULAR | Status: DC | PRN
  Administered 2020-09-29 (×2): 50 ug via INTRAVENOUS

## 2020-09-29 MED ORDER — SUGAMMADEX SODIUM 200 MG/2ML IV SOLN
INTRAVENOUS | Status: DC | PRN
  Administered 2020-09-29: 200 mg via INTRAVENOUS

## 2020-09-29 MED ORDER — ROCURONIUM BROMIDE 10 MG/ML (PF) SYRINGE
PREFILLED_SYRINGE | INTRAVENOUS | Status: AC
  Filled 2020-09-29: qty 10

## 2020-09-29 MED ORDER — POVIDONE-IODINE 10 % EX SWAB: 2.0000 "application " | Freq: Once | CUTANEOUS | Status: DC

## 2020-09-29 MED ORDER — METOCLOPRAMIDE HCL 5 MG PO TABS: 5.0000 mg | ORAL_TABLET | Freq: Three times a day (TID) | ORAL | Status: DC | PRN

## 2020-09-29 MED ORDER — ONDANSETRON HCL 4 MG/2ML IJ SOLN
INTRAMUSCULAR | Status: DC | PRN
  Administered 2020-09-29: 4 mg via INTRAVENOUS

## 2020-09-29 MED ORDER — METOCLOPRAMIDE HCL 5 MG/ML IJ SOLN: 5.0000 mg | Freq: Three times a day (TID) | INTRAMUSCULAR | Status: DC | PRN

## 2020-09-29 MED ORDER — CEFAZOLIN SODIUM-DEXTROSE 2-4 GM/100ML-% IV SOLN
2.0000 g | INTRAVENOUS | Status: AC
  Administered 2020-09-29: 2 g via INTRAVENOUS

## 2020-09-29 MED ORDER — ENOXAPARIN SODIUM 40 MG/0.4ML ~~LOC~~ SOLN
40.0000 mg | SUBCUTANEOUS | Status: DC
  Administered 2020-09-30: 40 mg via SUBCUTANEOUS
  Filled 2020-09-29: qty 0.4

## 2020-09-29 MED ORDER — KETOROLAC TROMETHAMINE 30 MG/ML IJ SOLN
30.0000 mg | Freq: Once | INTRAMUSCULAR | Status: AC
  Administered 2020-09-29: 30 mg via INTRAVENOUS

## 2020-09-29 MED ORDER — VANCOMYCIN HCL 1000 MG IV SOLR
INTRAVENOUS | Status: DC | PRN
  Administered 2020-09-29: 1000 mg

## 2020-09-29 MED ORDER — ONDANSETRON HCL 4 MG/2ML IJ SOLN
INTRAMUSCULAR | Status: AC
  Filled 2020-09-29: qty 2

## 2020-09-29 MED ORDER — CEFAZOLIN SODIUM-DEXTROSE 2-4 GM/100ML-% IV SOLN
2.0000 g | Freq: Three times a day (TID) | INTRAVENOUS | Status: AC
  Administered 2020-09-29 – 2020-09-30 (×3): 2 g via INTRAVENOUS
  Filled 2020-09-29 (×3): qty 100

## 2020-09-29 MED ORDER — MIDAZOLAM HCL 5 MG/5ML IJ SOLN
INTRAMUSCULAR | Status: DC | PRN
  Administered 2020-09-29: 2 mg via INTRAVENOUS

## 2020-09-29 MED ORDER — ONDANSETRON HCL 4 MG/2ML IJ SOLN: 4.0000 mg | Freq: Once | INTRAMUSCULAR | Status: DC | PRN

## 2020-09-29 MED ORDER — METHOCARBAMOL 500 MG PO TABS
ORAL_TABLET | ORAL | Status: AC
  Filled 2020-09-29: qty 1

## 2020-09-29 MED ORDER — DEXAMETHASONE SODIUM PHOSPHATE 10 MG/ML IJ SOLN
INTRAMUSCULAR | Status: DC | PRN
  Administered 2020-09-29: 10 mg via INTRAVENOUS

## 2020-09-29 MED ORDER — DEXAMETHASONE SODIUM PHOSPHATE 10 MG/ML IJ SOLN
INTRAMUSCULAR | Status: AC
  Filled 2020-09-29: qty 1

## 2020-09-29 MED ORDER — DIPHENHYDRAMINE HCL 50 MG/ML IJ SOLN
INTRAMUSCULAR | Status: DC | PRN
  Administered 2020-09-29: 25 mg via INTRAVENOUS

## 2020-09-29 MED ORDER — FENTANYL CITRATE (PF) 250 MCG/5ML IJ SOLN
INTRAMUSCULAR | Status: DC | PRN
Start: 2020-09-29 — End: 2020-09-29
  Administered 2020-09-29 (×5): 50 ug via INTRAVENOUS

## 2020-09-29 MED ORDER — CHLORHEXIDINE GLUCONATE 4 % EX LIQD: 60.0000 mL | Freq: Once | CUTANEOUS | Status: DC

## 2020-09-29 MED ORDER — OXYCODONE HCL 5 MG PO TABS: 5.0000 mg | ORAL_TABLET | Freq: Once | ORAL | Status: DC | PRN

## 2020-09-29 MED ORDER — GABAPENTIN 100 MG PO CAPS
100.0000 mg | ORAL_CAPSULE | Freq: Three times a day (TID) | ORAL | Status: DC
  Administered 2020-09-29 – 2020-09-30 (×4): 100 mg via ORAL
  Filled 2020-09-29 (×4): qty 1

## 2020-09-29 MED ORDER — PROPOFOL 10 MG/ML IV BOLUS
INTRAVENOUS | Status: DC | PRN
  Administered 2020-09-29: 150 mg via INTRAVENOUS

## 2020-09-29 MED ORDER — KETOROLAC TROMETHAMINE 30 MG/ML IJ SOLN
INTRAMUSCULAR | Status: AC
  Filled 2020-09-29: qty 1

## 2020-09-29 MED ORDER — LACTATED RINGERS IV SOLN: INTRAVENOUS | Status: DC | PRN

## 2020-09-29 MED ORDER — DIPHENHYDRAMINE HCL 50 MG/ML IJ SOLN
INTRAMUSCULAR | Status: AC
  Filled 2020-09-29: qty 1

## 2020-09-29 MED ORDER — ENSURE PRE-SURGERY PO LIQD
296.0000 mL | Freq: Once | ORAL | Status: DC
  Filled 2020-09-29: qty 296

## 2020-09-29 MED ORDER — 0.9 % SODIUM CHLORIDE (POUR BTL) OPTIME
TOPICAL | Status: DC | PRN
  Administered 2020-09-29: 1000 mL

## 2020-09-29 MED ORDER — ONDANSETRON HCL 4 MG PO TABS: 4.0000 mg | ORAL_TABLET | Freq: Four times a day (QID) | ORAL | Status: DC | PRN

## 2020-09-29 MED ORDER — OXYCODONE HCL 5 MG/5ML PO SOLN: 5.0000 mg | Freq: Once | ORAL | Status: DC | PRN

## 2020-09-29 SURGICAL SUPPLY — 76 items
BIPOLAR DEPUY 50 (Hips) ×2 IMPLANT
BIPOLAR DEPUY 50MM (Hips) ×1 IMPLANT
BIT DRILL 2.5X110 QC LCP DISP (BIT) ×3 IMPLANT
BIT DRILL 2.8 (BIT) ×1
BIT DRILL CAL 3.2 LONG (BIT) ×2 IMPLANT
BIT DRILL CAL 3.2MM LONG (BIT) ×1
BIT DRILL CANN 2.7X625 NONSTRL (BIT) ×3 IMPLANT
BIT DRILL CANN QC 2.8X165 (BIT) ×1 IMPLANT
BIT DRILL SHORT 3.2MM (DRILL) ×2 IMPLANT
BLADE SURG 10 STRL SS (BLADE) ×6 IMPLANT
BNDG COHESIVE 4X5 TAN STRL (GAUZE/BANDAGES/DRESSINGS) ×3 IMPLANT
BNDG ELASTIC 4X5.8 VLCR NS LF (GAUZE/BANDAGES/DRESSINGS) ×3 IMPLANT
BNDG ELASTIC 6X10 VLCR STRL LF (GAUZE/BANDAGES/DRESSINGS) ×3 IMPLANT
BRUSH SCRUB EZ PLAIN DRY (MISCELLANEOUS) ×6 IMPLANT
CHLORAPREP W/TINT 26 (MISCELLANEOUS) ×6 IMPLANT
CLOSURE WOUND 1/2 X4 (GAUZE/BANDAGES/DRESSINGS) ×1
COVER SURGICAL LIGHT HANDLE (MISCELLANEOUS) ×3 IMPLANT
DRAPE C-ARM 42X72 X-RAY (DRAPES) ×3 IMPLANT
DRAPE C-ARMOR (DRAPES) ×3 IMPLANT
DRAPE ORTHO SPLIT 77X108 STRL (DRAPES) ×6
DRAPE SURG ORHT 6 SPLT 77X108 (DRAPES) ×2 IMPLANT
DRAPE U-SHAPE 47X51 STRL (DRAPES) ×3 IMPLANT
DRILL BIT 2.8MM (BIT) ×3
DRILL SHORT 3.2MM (DRILL) ×6
DRSG ADAPTIC 3X8 NADH LF (GAUZE/BANDAGES/DRESSINGS) ×3 IMPLANT
ELECT REM PT RETURN 9FT ADLT (ELECTROSURGICAL) ×3
ELECTRODE REM PT RTRN 9FT ADLT (ELECTROSURGICAL) ×1 IMPLANT
GAUZE SPONGE 4X4 12PLY STRL (GAUZE/BANDAGES/DRESSINGS) ×3 IMPLANT
GLOVE BIO SURGEON STRL SZ 6.5 (GLOVE) ×6 IMPLANT
GLOVE BIO SURGEON STRL SZ7.5 (GLOVE) ×12 IMPLANT
GLOVE BIO SURGEONS STRL SZ 6.5 (GLOVE) ×3
GLOVE BIOGEL PI IND STRL 6.5 (GLOVE) ×1 IMPLANT
GLOVE BIOGEL PI IND STRL 7.5 (GLOVE) ×1 IMPLANT
GLOVE BIOGEL PI INDICATOR 6.5 (GLOVE) ×2
GLOVE BIOGEL PI INDICATOR 7.5 (GLOVE) ×2
GOWN STRL REUS W/ TWL LRG LVL3 (GOWN DISPOSABLE) ×2 IMPLANT
GOWN STRL REUS W/TWL LRG LVL3 (GOWN DISPOSABLE) ×6
GUIDEWARE NON THREAD 1.25X150 (WIRE) ×6
GUIDEWIRE 3.2X400 (WIRE) ×3 IMPLANT
GUIDEWIRE NON THREAD 1.25X150 (WIRE) ×2 IMPLANT
HEAD BIPOLAR DEPUY 50 (Hips) ×1 IMPLANT
K-WIRE 1.25 TRCR POINT 150 (WIRE) ×3
KIT BASIN OR (CUSTOM PROCEDURE TRAY) ×3 IMPLANT
KIT TURNOVER KIT B (KITS) ×3 IMPLANT
KWIRE 1.25 TRCR POINT 150 (WIRE) ×1 IMPLANT
NAIL TIBIAL EX 9X330MM (Nail) ×3 IMPLANT
PACK TOTAL JOINT (CUSTOM PROCEDURE TRAY) ×3 IMPLANT
PAD ARMBOARD 7.5X6 YLW CONV (MISCELLANEOUS) ×6 IMPLANT
PLATE LCP 3.5 1/3 TUB 8HX93 (Plate) ×3 IMPLANT
REAMER ROD DEEP FLUTE 2.5X950 (INSTRUMENTS) ×3 IMPLANT
SCREW CANN TI L-THRD 4.0X38 (Screw) ×3 IMPLANT
SCREW CANN TI L-THRD 4.0X42 (Screw) ×3 IMPLANT
SCREW CORTEX 3.5 14MM (Screw) ×2 IMPLANT
SCREW CORTEX 3.5 16MM (Screw) ×4 IMPLANT
SCREW LOCK 4X30 TI (Screw) ×3 IMPLANT
SCREW LOCK 4X64 TI (Screw) ×3 IMPLANT
SCREW LOCK CORT ST 3.5X14 (Screw) ×1 IMPLANT
SCREW LOCK CORT ST 3.5X16 (Screw) ×2 IMPLANT
SCREW LOCK T15 FT 18X3.5X2.9X (Screw) ×2 IMPLANT
SCREW LOCK T25 FT 32X4X3.3X (Screw) ×1 IMPLANT
SCREW LOCK TI 4X42 (Screw) ×3 IMPLANT
SCREW LOCKING 3.5X18 (Screw) ×6 IMPLANT
SCREW LOCKING 4.0 34MM (Screw) ×3 IMPLANT
SCREW LOCKING 4X32 (Screw) ×3 IMPLANT
SPLINT PLASTER CAST XFAST 5X30 (CAST SUPPLIES) ×1 IMPLANT
SPLINT PLASTER XFAST SET 5X30 (CAST SUPPLIES) ×2
STRIP CLOSURE SKIN 1/2X4 (GAUZE/BANDAGES/DRESSINGS) ×2 IMPLANT
SUT ETHILON 3 0 PS 1 (SUTURE) ×6 IMPLANT
SUT MNCRL AB 3-0 PS2 27 (SUTURE) ×6 IMPLANT
SUT VIC AB 0 CT1 27 (SUTURE) ×3
SUT VIC AB 0 CT1 27XBRD ANBCTR (SUTURE) ×1 IMPLANT
SUT VIC AB 2-0 CT1 27 (SUTURE)
SUT VIC AB 2-0 CT1 TAPERPNT 27 (SUTURE) IMPLANT
TOWEL GREEN STERILE (TOWEL DISPOSABLE) ×6 IMPLANT
TOWEL GREEN STERILE FF (TOWEL DISPOSABLE) ×3 IMPLANT
YANKAUER SUCT BULB TIP NO VENT (SUCTIONS) ×3 IMPLANT

## 2020-09-29 NOTE — Plan of Care (Signed)

## 2020-09-29 NOTE — Care Management (Cosign Needed)
    Durable Medical Equipment  (From admission, onward)         Start     Ordered   09/29/20 1534  For home use only DME Walker rolling  Once       Question Answer Comment  Walker: With 5 Inch Wheels   Patient needs a walker to treat with the following condition Tibia/fibula fracture      09/29/20 1533   09/29/20 1533  For home use only DME standard manual wheelchair with seat cushion  Once       Comments: Patient suffers from fracture and repair of left tib/fib fracture which impairs their ability to perform daily activities like ADLs:[id number] in the home.  A walking HQP:[id number] will not resolve issue with performing activities of daily living. A wheelchair will allow patient to safely perform daily activities. Patient can safely propel the wheelchair in the home or has a caregiver who can provide assistance. Length of need 6 months. Accessories: elevating leg rests (ELRs), wheel locks, extensions and anti-tippers.   09/29/20 1533   09/29/20 1532  For home use only DME Other see comment  Once       Comments: Needs rolling knee scooter - for fx and repair of Left tib/fib  Question:  Length of Need  Answer:  6 Months   09/29/20 1533

## 2020-09-29 NOTE — Anesthesia Procedure Notes (Signed)
Procedure Name: Intubation Date/Time: 09/29/2020 8:36 AM Performed by: Griffin Dakin, CRNA Pre-anesthesia Checklist: Patient identified, Emergency Drugs available, Suction available and Patient being monitored Patient Re-evaluated:Patient Re-evaluated prior to induction Oxygen Delivery Method: Circle system utilized Preoxygenation: Pre-oxygenation with 100% oxygen Induction Type: IV induction Ventilation: Mask ventilation without difficulty Laryngoscope Size: Mac and 3 Grade View: Grade I Tube type: Oral Tube size: 7.0 mm Number of attempts: 1 Airway Equipment and Method: Stylet and Oral airway Placement Confirmation: ETT inserted through vocal cords under direct vision,  positive ETCO2 and breath sounds checked- equal and bilateral Secured at: 21 cm Tube secured with: Tape Dental Injury: Teeth and Oropharynx as per pre-operative assessment

## 2020-09-29 NOTE — Transfer of Care (Signed)
Immediate Anesthesia Transfer of Care Note  Patient: Latasha Warren  Procedure(s) Performed: INTRAMEDULLARY (IM) NAIL TIBIAL (Left )  Patient Location: PACU  Anesthesia Type:General  Level of Consciousness: awake, alert  and oriented  Airway & Oxygen Therapy: Patient Spontanous Breathing  Post-op Assessment: Report given to RN and Post -op Vital signs reviewed and stable  Post vital signs: Reviewed and stable  Last Vitals:  Vitals Value Taken Time  BP 185/98 09/29/20 1111  Temp    Pulse 126 09/29/20 1112  Resp 14 09/29/20 1113  SpO2 99 % 09/29/20 1112  Vitals shown include unvalidated device data.  Last Pain:  Vitals:   09/29/20 0715  TempSrc:   PainSc: 0-No pain         Complications: No complications documented.

## 2020-09-29 NOTE — Op Note (Signed)
Orthopaedic Surgery Operative Note (CSN: [id number] ) Date of Surgery: 09/29/2020  Admit Date: 09/28/2020   Diagnoses: Pre-Op Diagnoses: Left closed tibia shaft fracture Left pilon fracture Left distal fibula fracture   Post-Op Diagnosis: Same  Procedures: 1. CPT 27828-Open reduction internal fixation of left pilon fracture (tibia/fibula) 2. CPT 27759-Intramedullary nailing of left tibia shaft fracture  Surgeons : Primary: Annalyssa Thune, Gillie Manners, MD  Assistant: Ulyses Southward, PA-C  Location: OR 7   Anesthesia:General  Antibiotics: Ancef 2g preop with 1 gm vancomycin powder placed topically   Tourniquet time:None    Estimated Blood Loss:100 mL  Complications:None   Specimens:None   Implants: Implant Name Type Inv. Item Serial No. Manufacturer Lot No. LRB No. Used Action  NAIL TIBIAL EX 9X330MM - [id number] Nail NAIL TIBIAL EX 9X330MM  [employer] [id number] Left 1 Implanted  SCREW CANNULATED 4.0MM - [id number] Screw SCREW CANNULATED 4.0MM  [employer]  Left 1 Implanted  BIPOLAR DEPUY - [id number] Hips BIPOLAR DEPUY  [employer]  Left 1 Implanted  SCREW CANNULATED 4.0MM - [id number] Screw SCREW CANNULATED 4.0MM  [employer]  Left 1 Implanted  SCREW LOCK 4X30 TI - [id number] Screw SCREW LOCK 4X30 TI  [employer]  Left 1 Implanted  SCREW LOCKING 4.0 - [id number] Screw SCREW LOCKING 4.0  [employer]  Left 1 Implanted  SCREW LOCK 4X62 TI - [id number] Screw SCREW LOCK 4X62 TI  [employer]  Left 1 Implanted  SCREW LOCK 4X64 TI - [id number] Screw SCREW LOCK 4X64 TI  [employer]  Left 1 Implanted  SCREW LOCK STAR 5X32 - [id number] Screw SCREW LOCK STAR 5X32  [employer]  Left 1 Implanted  SCREW CORTEX 3.5 - [id number] Screw SCREW CORTEX 3.5  [employer]  Left 1 Implanted  SCREW CORTEX 3.5 - [id number] Screw SCREW CORTEX 3.5  [employer]  Left 2 Implanted  PLATE TUBULAR W/COLLAR -  [id number] Plate PLATE TUBULAR W/COLLAR  [employer]  Left 1 Implanted  SCREW LOCKING 3.5X18 - [id number] Screw SCREW LOCKING 3.5X18  [employer]  Left 2 Implanted     Indications for Surgery: 39 year old female who slipped and fell and sustained a left tibial shaft fracture with a distal fibula fracture and a intra-articular distal tibia fracture.  I felt that she was indicated for surgical fixation including intramedullary nailing of her tibia fracture with a percutaneous versus open fixation of her distal tibia as well as stress examination and possible open reduction internal fixation of her distal fibula.  Risks and benefits were discussed with the patient.  Risks include but not limited to bleeding, infection, malunion, nonunion, hardware failure, hardware irritation, nerve and blood vessel injury, ankle stiffness, ankle arthritis, DVT, even the possibility anesthetic complications.  She agreed to proceed with surgery and consent was obtained.  Operative Findings: 1.  Percutaneous fixation of intra-articular distal tibia fracture using Synthes 4.0 mm titanium cannulated screws 2.  Intramedullary nailing of left tibial shaft fracture using Synthes 9 x 330 mm EX nail 3.  Stress examination after intramedullary nailing showing medial clear space widening requiring open reduction internal fixation of distal fibula fracture using Synthes 8 hole one third tubular plate  Procedure: The patient was identified in the preoperative holding area. Consent was confirmed with the patient and their family and all questions were answered. The operative extremity was marked after confirmation with the patient. she was then brought back to the operating room by our anesthesia colleagues.  She  was placed under general anesthetic and carefully transferred over to a radiolucent flat top table.  A bump was placed under her operative hip.  Left lower extremity was then prepped and draped in usual sterile  fashion.  A timeout was performed to verify the patient, the procedure, and the extremity.  Preoperative antibiotics were dosed.  Fluoroscopic imaging showed the unstable nature of her injury.  I first started out by making an anterior lateral and posterior lateral percutaneous incision using fluoroscopic guidance.  I bluntly dissected to bone and used a reduction tenaculum to reduce and manipulate the intra-articular distal tibia fracture.  I was able to get near anatomic reduction with percutaneous reduction.  I confirmed this with fluoroscopy.  I then used 4.0 mm titanium cannulated screws from [employer]ynthe[employer].  I directed the guidewires from anterior to posterior.  I placed 2 guidewires and confirmed positioning with AP and lateral fluoroscopic imaging of the ankle.  I then drilled and then placed screws gaining good bicortical purchase.  Fluoroscopy confirmed the length and position of the screws.  I kept the clamp in place while I proceeded to work on intramedullary nailing of the left tibia.  A lateral parapatellar incision was carried down through skin and subcutaneous tissue.  I released the retinaculum of the lateral patella to mobilize the patella medially.  I released the retinaculum lateral to the patellar tendon.  I then placed a threaded guidewire extra-articular at the appropriate starting point on AP and lateral fluoroscopic imaging.  I then advanced it into the metaphysis.  I used an entry reamer to enter the medullary canal.  I then passed a ball-tipped guidewire down the center of the canal and seated it into the distal tibia.  I then measured the length of the nail and chose to use a 330 mm nail.  I then sequentially reamed from 8.41mm to 10.5 mm.  I obtained excellent chatter and decided to place a 9 mm nail.  The nail was then passed.  I confirmed position and length of the nail.  I then used perfect circle technique to place medial to lateral distal interlocking screws.  I also placed a oblique  distal interlocking screw to reinforce the fixation of the intra-articular tibia fracture.  I then used the targeting arm proximally to place a 2 interlocking screws.  The targeting arm was then removed and final fluoroscopic imaging was obtained of the tibia.  A external rotation stress view was then performed of the ankle.  There was subtle medial clear space widening.  With the significant comminution of the fibula I felt that open reduction internal fixation was the most appropriate to stabilize the ankle.  A lateral approach to the distal fibula was carried down through skin and subcutaneous tissue.  I carefully dissected through the soft tissue to prevent any damage to the superficial peroneal nerve.  I then exposed the fracture and used a reduction tenaculums to reduce the fibula fracture.  I held this provisionally with K wires.  I then contoured an 8 hole Synthes one third tubular plate along the lateral aspect of the distal fibula.  I placed a nonlocking screw into the fibular shaft and a nonlocking screw into the distal fibular segment.  I confirmed positioning of the plate with fluoroscopy.  I then placed on nonlocking screws into the fibular shaft and locking screws into the distal fibula.  Final fluoroscopic imaging was obtained.  External rotation stress view was once more obtained.  There is no  further medial clear space widening.  The incisions were then copiously irrigated.  A gram of vancomycin powder was placed into the incisions.  A layer closure of 0 Vicryl, 2-0 Vicryl and 3-0 Monocryl at the knee and 3-0 nylon at the ankle was used to close the skin.  A well-padded short leg splint was then placed after sterile dressings were applied.  The patient was awoken from anesthesia and taken to the PACU in stable condition.  Post Op Plan/Instructions: Patient will be nonweightbearing to the left lower extremity.  She will receive postoperative Ancef.  She will receive Lovenox for DVT prophylaxis  while in the hospital and discharged home on aspirin.  We will have her mobilize with physical and Occupational Therapy.  I was present and performed the entire surgery.  Ulyses Southward, PA-C did assist me throughout the case. An assistant was necessary given the difficulty in approach, maintenance of reduction and ability to instrument the fracture.   Truitt Merle, MD [facility]

## 2020-09-29 NOTE — Evaluation (Signed)
Physical Therapy Evaluation Patient Details Name: Latasha Warren MRN: [id number] DOB: [birth year] Today's Date: 09/29/2020   History of Present Illness  Patient  is a 39 y/o female who slipped outside and fell backwards, unable to bear weight upon standing. X-rays showed a tib/fib fx and orthopedic surgery was consulted. No significant PMH. Patient underwent ORIF and IM of L tibia and is currently NWB.  Clinical Impression  PTA, patient was independent with all mobility and working full time as Runner, broadcasting/film/video. Patient was able to ambulate 12' with RW and min guard while maintaining WB precautions Patient is currently limited by pain, L LE weakness, WB restrictions, decreased activity tolerance, and impaired functional mobility. Patient asked about knee scooter for use at work, discussed with her about pain tolerance to pressure on lower leg but will continue to evaluate if knee scooter is appropriate prior to discharge. Patient will benefit from skilled PT services during her acute stay to address listed deficits. Anticipate no follow up PT services at this time, however recommend OPPT once WB restrictions change.     Follow Up Recommendations No PT follow up;Supervision for mobility/OOB (Recommend OPPT following WB restriction change)    Equipment Recommendations  Rolling Aynslee Mulhall with 5" wheels;Wheelchair (measurements PT)    Recommendations for Other Services       Precautions / Restrictions Precautions Precautions: Fall Restrictions Weight Bearing Restrictions: Yes LLE Weight Bearing: Non weight bearing      Mobility  Bed Mobility Overal bed mobility: Needs Assistance Bed Mobility: Supine to Sit     Supine to sit: Min assist;HOB elevated     General bed mobility comments: Pt required assistance for advancement of the L LE off of bed  Transfers Overall transfer level: Needs assistance Equipment used: Rolling Younes Degeorge (2 wheeled) Transfers: Sit to/from Stand Sit to Stand: Min  guard         General transfer comment: Pt was able to perform sit to stand to RW with min guard, cues for hand placement   Ambulation/Gait Ambulation/Gait assistance: Min guard Gait Distance (Feet): 12 Feet Assistive device: Rolling Ahmon Tosi (2 wheeled) Gait Pattern/deviations: Step-to pattern        Stairs            Wheelchair Mobility    Modified Rankin (Stroke Patients Only)       Balance Overall balance assessment: Needs assistance Sitting-balance support: No upper extremity supported;Feet supported Sitting balance-Leahy Scale: Fair     Standing balance support: Bilateral upper extremity supported;During functional activity Standing balance-Leahy Scale: Fair                               Pertinent Vitals/Pain Pain Assessment: Faces Faces Pain Scale: Hurts even more Pain Location: L lower leg Pain Descriptors / Indicators: Heaviness;Grimacing;Guarding;Tightness Pain Intervention(s): Monitored during session;Repositioned    Home Living Family/patient expects to be discharged to:: Private residence Living Arrangements: Spouse/significant other Available Help at Discharge: Family Type of Home: House Home Access: Stairs to enter Entrance Stairs-Rails: Can reach both Entrance Stairs-Number of Steps: 2 Home Layout: One level Home Equipment: None Additional Comments: mother in law is going to purchase a shower chair    Prior Function Level of Independence: Independent         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     Hand Dominance        Extremity/Trunk Assessment   Upper Extremity Assessment Upper Extremity Assessment: Overall WFL for tasks assessed  Lower Extremity Assessment Lower Extremity Assessment: LLE deficits/detail LLE Deficits / Details: unable to assess due to WB status and pain LLE: Unable to fully assess due to immobilization;Unable to fully assess due to pain       Communication   Communication: No difficulties   Cognition Arousal/Alertness: Awake/alert Behavior During Therapy: WFL for tasks assessed/performed Overall Cognitive Status: Within Functional Limits for tasks assessed                                        General Comments      Exercises     Assessment/Plan    PT Assessment Patient needs continued PT services  PT Problem List Decreased strength;Decreased range of motion;Decreased activity tolerance;Decreased balance;Decreased mobility;Pain       PT Treatment Interventions DME instruction;Gait training;Stair training;Functional mobility training;Therapeutic activities;Therapeutic exercise;Balance training    PT Goals (Current goals can be found in the Care Plan section)  Acute Rehab PT Goals Patient Stated Goal: to get back to work PT Goal Formulation: With patient Time For Goal Achievement: 10/13/20 Potential to Achieve Goals: Good    Frequency Min 5X/week   Barriers to discharge        Co-evaluation               AM-PAC PT "6 Clicks" Mobility  Outcome Measure Help needed turning from your back to your side while in a flat bed without using bedrails?: A Little Help needed moving from lying on your back to sitting on the side of a flat bed without using bedrails?: A Little Help needed moving to and from a bed to a chair (including a wheelchair)?: A Little Help needed standing up from a chair using your arms (e.g., wheelchair or bedside chair)?: A Little Help needed to walk in hospital room?: A Little Help needed climbing 3-5 steps with a railing? : A Little 6 Click Score: 18    End of Session Equipment Utilized During Treatment: Gait belt Activity Tolerance: Patient tolerated treatment well Patient left: in chair;with call bell/phone within reach Nurse Communication: Mobility status PT Visit Diagnosis: Unsteadiness on feet (R26.81);Other abnormalities of gait and mobility (R26.89);Difficulty in walking, not elsewhere classified  (R26.2);Pain Pain - Right/Left: Left Pain - part of body: Leg    Time: 7793-9030 PT Time Calculation (min) (ACUTE ONLY): 29 min   Charges:   PT Evaluation $PT Eval Low Complexity: 1 Low PT Treatments $Therapeutic Activity: 8-22 mins        Gregor Hams, PT, DPT Acute Rehabilitation Services Pager [phone] Office [phone]   Zannie Kehr Allred 09/29/2020, 2:37 PM

## 2020-09-29 NOTE — Interval H&P Note (Signed)
History and Physical Interval Note:  09/29/2020 8:16 AM  Latasha Warren  has presented today for surgery, with the diagnosis of Left tib/fib fx.  The various methods of treatment have been discussed with the patient and family. After consideration of risks, benefits and other options for treatment, the patient has consented to  Procedure(s): INTRAMEDULLARY (IM) NAIL TIBIAL (Left) and ORIF OF LEFT ANKLE as a surgical intervention.  The patient's history has been reviewed, patient examined, no change in status, stable for surgery.  I have reviewed the patient's chart and labs.  Questions were answered to the patient's satisfaction.     Caryn Bee P Gorman Safi

## 2020-09-29 NOTE — Anesthesia Preprocedure Evaluation (Signed)
Anesthesia Evaluation  Patient identified by MRN, date of birth, ID band Patient awake    Reviewed: Allergy & Precautions, NPO status , Patient's Chart, lab work & pertinent test results  Airway Mallampati: I  TM Distance: >3 FB Neck ROM: Full    Dental  (+) Teeth Intact, Dental Advisory Given   Pulmonary    breath sounds clear to auscultation       Cardiovascular  Rhythm:Regular Rate:Normal     Neuro/Psych    GI/Hepatic   Endo/Other    Renal/GU      Musculoskeletal   Abdominal   Peds  Hematology   Anesthesia Other Findings   Reproductive/Obstetrics                             Anesthesia Physical Anesthesia Plan  ASA: I  Anesthesia Plan: General   Post-op Pain Management:    Induction: Intravenous  PONV Risk Score and Plan: Dexamethasone and Ondansetron  Airway Management Planned: Oral ETT  Additional Equipment:   Intra-op Plan:   Post-operative Plan: Extubation in OR  Informed Consent: I have reviewed the patients History and Physical, chart, labs and discussed the procedure including the risks, benefits and alternatives for the proposed anesthesia with the patient or authorized representative who has indicated his/her understanding and acceptance.     Dental advisory given  Plan Discussed with: CRNA and Anesthesiologist  Anesthesia Plan Comments:         Anesthesia Quick Evaluation

## 2020-09-29 NOTE — TOC Initial Note (Signed)
Transition of Care [facility]) - Initial/Assessment Note    Patient Details  Name: Latasha Warren MRN: [id number] Date of Birth: November 15, 1981  Transition of [facility]) CM/SW Contact:    Curlene Labrum, RN Phone Number: 09/29/2020, 3:51 PM  Clinical Narrative:                 Case management met with the patient regarding transitions of care to home.  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  She will be returning home with her husband tomorrow after her discharge.  The patient is fully vaccinated for COVID.  The patient was given choice regarding dme - she did not have a preference.  I called the worker's comp # listed on the facesheet and also left a message with `Toni Lancianese, HR assistant for worker's comp.  - leaving message with dme needs.  I called [employer] and ordered the rolling walker and wheelchair to be delivered to the patient's hospital room in time for patient's discharge tomorrow.  I also mentioned that the patient will need a knee scooter and dme orders were left for RW, WC and Knee scooter.  I left the address for the knee scooter to be picked up by the patient's husband at the [employer] in [facility] [place] at a later date.  The patient does not have a PCP.  She was given the [facility] number to set up an appointment with a PCP that is covered by her private insurance through [employer].  Will continue to follow the patient for discharge needs for discharge tomorrow.  Expected Discharge Plan: OP Rehab Barriers to Discharge: No Barriers Identified (waiting on wheelchair and rolling walker to be delivered to the patient's hospital room.)   Patient Goals and CMS Choice Patient states their goals for this hospitalization and ongoing recovery are:: Patient plans to discharge home tomorrow. CMS Medicare.gov Compare Post Acute Care list provided to:: Patient    Expected Discharge Plan and Services Expected Discharge  Plan: OP Rehab   Discharge Planning Services: CM Consult Post Acute Care Choice: Durable Medical Equipment Living arrangements for the past 2 months: Single Family Home                 DME Arranged: Walker rolling, Wheelchair manual (knee scooter) DME Agency: [employer] Date DME Agency Contacted: 09/29/20 Time DME Agency Contacted: [place] Representative spoke with at DME Agency: Spoke with [employer] liaison            Prior Living Arrangements/Services Living arrangements for the past 2 months: [place] with:: Spouse Patient language and need for interpreter reviewed:: Yes Do you feel safe going back to the place where you live?: Yes      Need for Family Participation in Patient Care: Yes (Comment) Care giver support system in place?: Yes (comment)   Criminal Activity/Legal Involvement Pertinent to Current Situation/Hospitalization: No - Comment as needed  Activities of Daily Living Home Assistive Devices/Equipment: None ADL Screening (condition at time of admission) Patient's cognitive ability adequate to safely complete daily activities?: Yes Is the patient deaf or have difficulty hearing?: No Does the patient have difficulty seeing, even when wearing glasses/contacts?: No Does the patient have difficulty concentrating, remembering, or making decisions?: No Patient able to express need for assistance with ADLs?: Yes Does the patient have difficulty dressing or bathing?: No Independently performs ADLs?: Yes (appropriate for developmental age) Does the patient have difficulty walking or climbing  stairs?: No Weakness of Legs: Left Weakness of Arms/Hands: None  Permission Sought/Granted Permission sought to share information with : Case Manager Permission granted to share information with : Yes, Verbal Permission Granted        Permission granted to share info w Relationship: Patient's husband - [place]     Emotional Assessment Appearance:: Appears stated  age Attitude/Demeanor/Rapport: Gracious Affect (typically observed): Accepting Orientation: : Oriented to Self, Oriented to Place, Oriented to  Time, Oriented to Situation Alcohol / Substance Use: Not Applicable Psych Involvement: No (comment)  Admission diagnosis:  Closed fracture of left tibia [S82.202A] Closed displaced comminuted fracture of shaft of left tibia, initial encounter [S82.252A] Closed fracture of distal end of left fibula, unspecified fracture morphology, initial encounter [R97.588T] Patient Active Problem List   Diagnosis Date Noted  . Closed fracture of left tibia 09/28/2020   PCP:  Patient, No Pcp Per Pharmacy:   [id number] [place] [address]Phone: [phone]: [phone]    Social Determinants of Health (SDOH) Interventions    Readmission Risk Interventions Readmission Risk Prevention Plan 09/29/2020  Post Dischage Appt Complete  Medication Screening Complete  Transportation Screening Complete

## 2020-09-29 NOTE — Discharge Instructions (Signed)
Truitt Merle, MD U[place]ward PA-C [facility] [address] [phone] Jani Files)   [phone] (fax)                                  POST-OPERATIVE INSTRUCTIONS     WEIGHT BEARING STATUS:  Non-weightbearing left lower extremity  RANGE OF MOTION/ACTIVITY:  Okay for knee motion as tolerated  WOUND CARE ? Please keep splint clean dry and intact until follow-up. If your splint gets wet for any reason please contact the office immediately.  Do not stick anything down your splint such as pencils, momey, hangers to try and scratch yourself.  If you feel itchy take Benadryl as prescribed on the bottle for itching ? You may shower on Post-Op Day #2.  ? You must keep splint dry during this process and may find that a plastic bag taped around the extremity or alternatively a towel based bath may be a better option.   ? If you get your splint wet or if it is damaged please contact our clinic.  EXERCISES ? Due to your splint being in place you will not be able to bear weight through your extremity.   ? DO NOT PUT ANY WEIGHT ON YOUR OPERATIVE LEG ? Please use crutches or a walker to avoid weight bearing.   DVT/PE prophylaxis: Aspirin 325 mg twice daily  DIET: As you were eating previously.  Can use over the counter stool softeners and bowel preparations, such as Miralax, to help with bowel movements.  Narcotics can be constipating.  Be sure to drink plenty of fluids  REGIONAL ANESTHESIA (NERVE BLOCKS)  The anesthesia team may have performed a nerve block for you if safe in the setting of your care.  This is a great tool used to minimize pain.  Typically the block may start wearing off overnight but the long acting medicine may last for 3-4 days.  The nerve block wearing off can be a challenging period but please utilize your as needed pain medications to try and manage this period.    POST-OP MEDICATIONS- Multimodal approach to pain control  In general your pain  will be controlled with a combination of substances.  Prescriptions unless otherwise discussed are electronically sent to your pharmacy.  This is a carefully made plan we use to minimize narcotic use.     - Meloxicam OR Celebrex - Anti-inflammatory medication taken on a scheduled basis  - Acetaminophen - Non-narcotic pain medicine taken on a scheduled basis   - Oxycodone - This is a strong narcotic, to be used only on an "as needed" basis for pain.  -  Aspirin 81mg  - This medicine is used to minimize the risk of blood clots after surgery.             -          Zofran - take as needed for nausea   FOLLOW-UP ? If you develop a Fever (>101.5), Redness or Drainage from the surgical incision site, please call our office to arrange for an evaluation. ? Please call the office to schedule a follow-up appointment for your incision check if you do not already have one, 7-10 days post-operatively.   VISIT OUR WEBSITE FOR ADDITIONAL INFORMATION: [email or web address]   HELPFUL INFORMATION  ? If you had a block, it will wear off between 8-24 hrs postop typically.  This is period when your pain may go from  nearly zero to the pain you would have had postop without the block.  This is an abrupt transition but nothing dangerous is happening.  You may take an extra dose of narcotic when this happens.  ? You should wean off your narcotic medicines as soon as you are able.  Most patients will be off or using minimal narcotics before their first postop appointment.   ? We suggest you use the pain medication the first night prior to going to bed, in order to ease any pain when the anesthesia wears off. You should avoid taking pain medications on an empty stomach as it will make you nauseous.  ? Do not drink alcoholic beverages or take illicit drugs when taking pain medications.  ? In most states it is against the law to drive while you are in a splint or sling.  And certainly against the law to drive while taking  narcotics.  ? You may return to work/school in the next couple of days when you feel up to it.   ? Pain medication may make you constipated.  Below are a few solutions to try in this order: - Decrease the amount of pain medication if you arent having pain. - Drink lots of decaffeinated fluids. - Drink prune juice and/or each dried prunes  o If the first 3 dont work start with additional solutions - Take Colace - an over-the-counter stool softener - Take Senokot - an over-the-counter laxative - Take Miralax - a stronger over-the-counter laxative

## 2020-09-30 ENCOUNTER — Encounter (HOSPITAL_COMMUNITY): Payer: Self-pay | Admitting: Student

## 2020-09-30 DIAGNOSIS — S82872A Displaced pilon fracture of left tibia, initial encounter for closed fracture: Secondary | ICD-10-CM

## 2020-09-30 DIAGNOSIS — S82832A Other fracture of upper and lower end of left fibula, initial encounter for closed fracture: Secondary | ICD-10-CM

## 2020-09-30 LAB — BASIC METABOLIC PANEL
Anion gap: 11 (ref 5–15)
BUN: 9 mg/dL (ref 6–20)
CO2: 21 mmol/L — ABNORMAL LOW (ref 22–32)
Calcium: 8.3 mg/dL — ABNORMAL LOW (ref 8.9–10.3)
Chloride: 106 mmol/L (ref 98–111)
Creatinine, Ser: 0.69 mg/dL (ref 0.44–1.00)
GFR, Estimated: >60 mL/min (ref >60)
Glucose, Bld: 125 mg/dL — ABNORMAL HIGH (ref 70–99)
Potassium: 4 mmol/L (ref 3.5–5.1)
Sodium: 138 mmol/L (ref 135–145)

## 2020-09-30 LAB — CBC
HCT: 30.8 % — ABNORMAL LOW (ref 36.0–46.0)
Hemoglobin: 9.8 g/dL — ABNORMAL LOW (ref 12.0–15.0)
MCH: 29.3 pg (ref 26.0–34.0)
MCHC: 31.8 g/dL (ref 30.0–36.0)
MCV: 91.9 fL (ref 80.0–100.0)
Platelets: 255 10*3/uL (ref 150–400)
RBC: 3.35 MIL/uL — ABNORMAL LOW (ref 3.87–5.11)
RDW: 13 % (ref 11.5–15.5)
WBC: 10.3 10*3/uL (ref 4.0–10.5)
nRBC: 0 % (ref 0.0–0.2)

## 2020-09-30 MED ORDER — OXYCODONE HCL 5 MG PO TABS
5.0000 mg | ORAL_TABLET | ORAL | 0 refills | Status: AC | PRN
Start: 2020-09-30 — End: ?

## 2020-09-30 MED ORDER — METHOCARBAMOL 500 MG PO TABS: 500.0000 mg | ORAL_TABLET | Freq: Four times a day (QID) | ORAL | 0 refills | Status: AC | PRN

## 2020-09-30 MED ORDER — ONDANSETRON HCL 4 MG PO TABS: 4.0000 mg | ORAL_TABLET | Freq: Four times a day (QID) | ORAL | 0 refills | Status: AC | PRN

## 2020-09-30 MED ORDER — ASPIRIN EC 325 MG PO TBEC: 325.0000 mg | DELAYED_RELEASE_TABLET | Freq: Two times a day (BID) | ORAL | 0 refills | Status: AC

## 2020-09-30 NOTE — Progress Notes (Signed)
Discharge package printed and instructions given to patient. Verbalizes understanding.  

## 2020-09-30 NOTE — TOC CAGE-AID Note (Signed)
Transition of Care [facility]) - CAGE-AID Screening   Patient Details  Name: Latasha Warren MRN: [id number] Date of Birth: [birth year]  [facility]) CM/SW Contact:    Emeterio Reeve, [place] Phone Number: 09/30/2020, 2:23 PM   Clinical Narrative:  CSW met with pt at bedside. CSW introduced self and explained her role at the hospital.  PT reports she has 2-4 beers 1-2 a week. Pt denies substance use. Pt declined resources.   CAGE-AID Screening:    Have You Ever Felt You Ought to Cut Down on Your Drinking or Drug Use?: No Have People Annoyed You By Critizing Your Drinking Or Drug Use?: No Have You Felt Bad Or Guilty About Your Drinking Or Drug Use?: No Have You Ever Had a Drink or Used Drugs First Thing In The Morning to Steady Your Nerves or to Get Rid of a Hangover?: No CAGE-AID Score: 0  Substance Abuse Education Offered: Yes    Blima Ledger, [place] Social Worker [phone]

## 2020-09-30 NOTE — Anesthesia Postprocedure Evaluation (Signed)
Anesthesia Post Note  Patient: Latasha Warren  Procedure(s) Performed: INTRAMEDULLARY (IM) NAIL TIBIAL (Left )     Patient location during evaluation: SICU Anesthesia Type: General Level of consciousness: sedated Pain management: pain level controlled Vital Signs Assessment: post-procedure vital signs reviewed and stable Respiratory status: patient remains intubated per anesthesia plan Cardiovascular status: stable Postop Assessment: no apparent nausea or vomiting Anesthetic complications: no   No complications documented.  Last Vitals:  Vitals:   09/30/20 0824 09/30/20 1502  BP: (!) 131/92 120/77  Pulse: 99 91  Resp: 18 18  Temp: 36.7 C 36.8 C  SpO2: 96% 99%    Last Pain:  Vitals:   09/30/20 1502  TempSrc: Oral  PainSc:                  Martina Brodbeck COKER

## 2020-09-30 NOTE — Plan of Care (Signed)

## 2020-09-30 NOTE — Evaluation (Signed)
Occupational Therapy Evaluation Patient Details Name: Latasha Warren MRN: [id number] DOB: 1981-12-15 Today's Date: 09/30/2020    History of Present Illness Patient  is a 39 y/o female who slipped outside and fell backwards, unable to bear weight upon standing. X-rays showed a tib/fib fx and orthopedic surgery was consulted. No significant PMH. Patient underwent ORIF and IM of L tibia and is currently NWB.   Clinical Impression   PTA patient independent and working. Admitted for above and limited by problem list below, including NWB and pain to L LE, impaired balance and decreased activity tolerance. Patient currently requires min guard to supervision for ADLs, min guard for transfers using RW.  Educated on precautions, ADL compensatory techniques (specifically 1 handed techniques for LB dressing/toileting, hemi techniques for LB dressing, lateral lean bathing seated), DME recommendations, safety.  Great setup for bathroom, and has already purchased shower chair for shower- reviewed techniques for bathroom mobility, ADLs and safety (agreeable to have support for shower transfers initially).  She reports she will have initial 24/7 support fading to intermittent after 1 week.  Based on performance today, no further OT needs have been identified and OT will sign off.  Thank you for this referral.     Follow Up Recommendations  No OT follow up;Supervision - Intermittent    Equipment Recommendations  None recommended by OT (already has Chase )    Recommendations for Other Services       Precautions / Restrictions Precautions Precautions: Fall Restrictions Weight Bearing Restrictions: Yes LLE Weight Bearing: Non weight bearing      Mobility Bed Mobility Overal bed mobility: Needs Assistance Bed Mobility: Supine to Sit     Supine to sit: Supervision;HOB elevated     General bed mobility comments: OOB in recliner upon entry  Transfers Overall transfer level: Needs  assistance Equipment used: Rolling walker (2 wheeled) Transfers: Sit to/from Stand Sit to Stand: Min guard         General transfer comment: for safety, good technique     Balance Overall balance assessment: Needs assistance Sitting-balance support: No upper extremity supported;Feet supported Sitting balance-Leahy Scale: Good     Standing balance support: Bilateral upper extremity supported;Single extremity supported;During functional activity Standing balance-Leahy Scale: Fair Standing balance comment: min guard for 1 handed techniques                            ADL either performed or assessed with clinical judgement   ADL Overall ADL's : Needs assistance/impaired     Grooming: Set up;Sitting   Upper Body Bathing: Set up;Sitting   Lower Body Bathing: Min guard;Sitting/lateral leans Lower Body Bathing Details (indicate cue type and reason): reviewed techniques for lateral leaning in shower on [place], also has grabbars for 1 handed technique as needed Upper Body Dressing : Set up;Sitting   Lower Body Dressing: Min guard;Sit to/from stand Lower Body Dressing Details (indicate cue type and reason): able to manage R sock, discussed compensatory techniques with donning L LE first and 1 handed techniques in standing; min guard sit to stand  Toilet Transfer: Min guard;Ambulation;RW Toilet Transfer Details (indicate cue type and reason): simulated in room Toileting- Clothing Manipulation and Hygiene: Min guard;Sit to/from stand   Tub/ Shower Transfer: Tub transfer;Min guard;Ambulation;Shower Dealer Details (indicate cue type and reason): simulated in room, min guard for safety and cueing for technique--educated on safety and encouarged support with transfers to [employer] mobility during  ADLs: Min guard;Rolling walker General ADL Comments: limited by L LE pain and NWB, good understanding of techniques for ADL compensation and  safety      Vision         Perception     Praxis      Pertinent Vitals/Pain Pain Assessment: 0-10 Pain Score: 4  Faces Pain Scale: Hurts little more Pain Location: L lower leg Pain Descriptors / Indicators: Grimacing;Guarding Pain Intervention(s): Limited activity within patient's tolerance;Monitored during session;Repositioned     Hand Dominance     Extremity/Trunk Assessment Upper Extremity Assessment Upper Extremity Assessment: Overall WFL for tasks assessed   Lower Extremity Assessment Lower Extremity Assessment: Defer to PT evaluation       Communication Communication Communication: No difficulties   Cognition Arousal/Alertness: Awake/alert Behavior During Therapy: WFL for tasks assessed/performed Overall Cognitive Status: Within Functional Limits for tasks assessed                                     General Comments  spouse present and supportive     Exercises     Shoulder Instructions      Home Living Family/patient expects to be discharged to:: Private residence Living Arrangements: Spouse/significant other Available Help at Discharge: Family Type of Home: House Home Access: Stairs to enter Secretary/administrator of Steps: 2 Entrance Stairs-Rails: Can reach both Home Layout: One level     Bathroom Shower/Tub: Chief Strategy Officer: Standard     Home Equipment: Grab bars - tub/shower;Hand held shower head;Shower seat          Prior Functioning/Environment Level of Independence: Independent        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        OT Problem List: Decreased activity tolerance;Pain;Decreased knowledge of precautions;Decreased knowledge of use of DME or AE;Impaired balance (sitting and/or standing)      OT Treatment/Interventions:      OT Goals(Current goals can be found in the care plan section) Acute Rehab OT Goals Patient Stated Goal: to get back to work OT Goal Formulation: With patient  OT  Frequency:     Barriers to D/C:            Co-evaluation              AM-PAC OT "6 Clicks" Daily Activity     Outcome Measure Help from another person eating meals?: None Help from another person taking care of personal grooming?: A Little Help from another person toileting, which includes using toliet, bedpan, or urinal?: A Little Help from another person bathing (including washing, rinsing, drying)?: A Little Help from another person to put on and taking off regular upper body clothing?: A Little Help from another person to put on and taking off regular lower body clothing?: A Little 6 Click Score: 19   End of Session Equipment Utilized During Treatment: Rolling walker;Gait belt Nurse Communication: Mobility status  Activity Tolerance: Patient tolerated treatment well Patient left: in chair;with call bell/phone within reach;with family/visitor present  OT Visit Diagnosis: Other abnormalities of gait and mobility (R26.89);Pain Pain - Right/Left: Left Pain - part of body: Leg                Time: 0630-1601 OT Time Calculation (min): 13 min Charges:  OT General Charges $OT Visit: 1 Visit OT Evaluation $OT Eval Low Complexity: 1 Low  Barry Brunner, OT Acute Herbalist  [phone] Office [phone]   Chancy Milroy 09/30/2020, 10:10 AM

## 2020-09-30 NOTE — TOC Transition Note (Signed)
Transition of Care [facility]) - CM/SW Discharge Note   Patient Details  Name: Latasha Warren MRN: [id number] Date of Birth: 1981/02/07  Transition of Care [facility]) CM/SW Contact:  Janae Bridgeman, RN Phone Number: 09/30/2020, 3:56 PM   Clinical Narrative:    Case management spoke with the patient and family at the bedside concerning transitions of care to home.  The patient did not receive dme for home from [employer] - [employer] was called and asked to deliver ordered wheelchair and rolling walker.  Patient will be ready for discharge once this dme is delivered to the room.  09/30/2020 1530 - [employer] was re-called to deliver wheelchair and rolling walker to the room for discharge to home.   Final next level of care: OP Rehab (Dr. Jena Gauss plans to set patient up after f/u visit at his office.) Barriers to Discharge: No Barriers Identified (waiting on wheelchair and rolling walker to be delivered to the patient's hospital room.)   Patient Goals and CMS Choice Patient states their goals for this hospitalization and ongoing recovery are:: Patient plans to discharge home tomorrow. CMS Medicare.gov Compare Post Acute Care list provided to:: Patient    Discharge Placement                       Discharge Plan and Services   Discharge Planning Services: CM Consult Post Acute Care Choice: Durable Medical Equipment          DME Arranged: Walker rolling, Wheelchair manual (knee scooter) DME Agency: [employer] Date DME Agency Contacted: 09/29/20 Time DME Agency Contacted: 1536 Representative spoke with at DME Agency: Spoke with [employer] liaison            Social Determinants of Health (SDOH) Interventions     Readmission Risk Interventions Readmission Risk Prevention Plan 09/29/2020  Post Dischage Appt Complete  Medication Screening Complete  Transportation Screening Complete

## 2020-09-30 NOTE — Discharge Summary (Signed)
Orthopaedic Trauma Service (OTS) Discharge Summary   Patient ID: Latasha Warren MRN: [id number] DOB/AGE: 06-06-1981 39 y.o.  Admit date: 09/28/2020 Discharge date: 09/30/2020  Admission Diagnoses: 1. Left closed tibia shaft fracture 2. Left pilon fracture 3. Left distal fibula fracture   Discharge Diagnoses:  Principal Problem:   Closed fracture of left tibia Active Problems:   Closed left pilon fracture   Closed fracture of left distal fibula   Past Medical History:  Diagnosis Date  . Endometriosis      Procedures Performed: 1. CPT 27828-Open reduction internal fixation of left pilon fracture (tibia/fibula) 2. CPT 27759-Intramedullary nailing of left tibia shaft fracture  Discharged Condition: good  Hospital Course: Patient presented to [facility] emergency department on 09/28/2020 for evaluation of left leg pain after sustaining a fall at work.  Imaging in the emergency department revealed left tibia/fibula fractures.  Orthopedic surgery was consulted.  Patient into the operating room on 09/29/2020 by Dr. Jena Gauss for the above procedures.  She tolerated this well without complications.  Was placed in a short leg splint postoperatively with instructions to be nonweightbearing on the left lower extremity.  Began working with physical and Occupational Therapy on the afternoon of surgery and did well with this.  Was started on Lovenox for DVT prophylaxis starting on postoperative day #1. On 09/30/2020, the patient was tolerating diet, working well with therapies, pain well controlled, vital signs stable, dressings clean, dry, intact and felt stable for discharge to home. Patient will follow up as below and knows to call with questions or concerns.     Consults: None  Significant Diagnostic Studies:   Results for orders placed or performed during the hospital encounter of 09/28/20 (from the past 168 hour(s))  CBC with Differential/Platelet   Collection Time:  09/28/20  3:48 PM  Result Value Ref Range   WBC 8.8 4.0 - 10.5 K/uL   RBC 4.21 3.87 - 5.11 MIL/uL   Hemoglobin 12.3 12.0 - 15.0 g/dL   HCT 06.2 36 - 46 %   MCV 93.1 80.0 - 100.0 fL   MCH 29.2 26.0 - 34.0 pg   MCHC 31.4 30.0 - 36.0 g/dL   RDW 37.6 28.3 - 15.1 %   Platelets 275 150 - 400 K/uL   nRBC 0.0 0.0 - 0.2 %   Neutrophils Relative % 76 %   Neutro Abs 6.8 1.7 - 7.7 K/uL   Lymphocytes Relative 15 %   Lymphs Abs 1.3 0.7 - 4.0 K/uL   Monocytes Relative 6 %   Monocytes Absolute 0.6 0.1 - 1.0 K/uL   Eosinophils Relative 1 %   Eosinophils Absolute 0.1 0.0 - 0.5 K/uL   Basophils Relative 1 %   Basophils Absolute 0.0 0.0 - 0.1 K/uL   Immature Granulocytes 1 %   Abs Immature Granulocytes 0.05 0.00 - 0.07 K/uL  Comprehensive metabolic panel   Collection Time: 09/28/20  3:48 PM  Result Value Ref Range   Sodium 137 135 - 145 mmol/L   Potassium 3.6 3.5 - 5.1 mmol/L   Chloride 103 98 - 111 mmol/L   CO2 23 22 - 32 mmol/L   Glucose, Bld 128 (H) 70 - 99 mg/dL   BUN 12 6 - 20 mg/dL   Creatinine, Ser 7.61 0.44 - 1.00 mg/dL   Calcium 8.8 (L) 8.9 - 10.3 mg/dL   Total Protein 6.5 6.5 - 8.1 g/dL   Albumin 4.0 3.5 - 5.0 g/dL   AST 24 15 - 41 U/L  ALT 16 0 - 44 U/L   Alkaline Phosphatase 35 (L) 38 - 126 U/L   Total Bilirubin 0.6 0.3 - 1.2 mg/dL   GFR, Estimated >15 >17 mL/min   Anion gap 11 5 - 15  Respiratory Panel by RT PCR (Flu A&B, Covid) - Nasopharyngeal Swab   Collection Time: 09/28/20  3:55 PM   Specimen: Nasopharyngeal Swab  Result Value Ref Range   SARS Coronavirus 2 by RT PCR NEGATIVE NEGATIVE   Influenza A by PCR NEGATIVE NEGATIVE   Influenza B by PCR NEGATIVE NEGATIVE  HIV Antibody (routine testing w rflx)   Collection Time: 09/29/20  2:27 AM  Result Value Ref Range   HIV Screen 4th Generation wRfx Non Reactive Non Reactive  Surgical pcr screen   Collection Time: 09/29/20  7:10 AM   Specimen: Nasal Mucosa; Nasal Swab  Result Value Ref Range   MRSA, PCR NEGATIVE  NEGATIVE   Staphylococcus aureus NEGATIVE NEGATIVE  VITAMIN D 25 Hydroxy (Vit-D Deficiency, Fractures)   Collection Time: 09/29/20  2:00 PM  Result Value Ref Range   Vit D, 25-Hydroxy 46.10 30 - 100 ng/mL  CBC   Collection Time: 09/30/20  1:37 AM  Result Value Ref Range   WBC 10.3 4.0 - 10.5 K/uL   RBC 3.35 (L) 3.87 - 5.11 MIL/uL   Hemoglobin 9.8 (L) 12.0 - 15.0 g/dL   HCT 61.6 (L) 36 - 46 %   MCV 91.9 80.0 - 100.0 fL   MCH 29.3 26.0 - 34.0 pg   MCHC 31.8 30.0 - 36.0 g/dL   RDW 07.3 71.0 - 62.6 %   Platelets 255 150 - 400 K/uL   nRBC 0.0 0.0 - 0.2 %  Basic metabolic panel   Collection Time: 09/30/20  1:37 AM  Result Value Ref Range   Sodium 138 135 - 145 mmol/L   Potassium 4.0 3.5 - 5.1 mmol/L   Chloride 106 98 - 111 mmol/L   CO2 21 (L) 22 - 32 mmol/L   Glucose, Bld 125 (H) 70 - 99 mg/dL   BUN 9 6 - 20 mg/dL   Creatinine, Ser 9.48 0.44 - 1.00 mg/dL   Calcium 8.3 (L) 8.9 - 10.3 mg/dL   GFR, Estimated >54 >62 mL/min   Anion gap 11 5 - 15     Treatments: IV hydration, antibiotics: Ancef, analgesia: acetaminophen, Morphine and oxycodone, anticoagulation: LMW heparin, therapies: PT and OT and surgery: as above  Discharge Exam: General: NAD, sitting up in bed LLE: Well padded, well fitting splint in place. Able to wiggles toes. Endorses sensation to light touch. +EHL. +FHL. Toes warm and well perfused  Disposition: Discharge disposition: 01-Home or Self Care        Allergies as of 09/30/2020      Reactions   Erythromycin Hives      Medication List    TAKE these medications   aspirin EC 325 MG tablet Take 1 tablet (325 mg total) by mouth in the morning and at bedtime.   methocarbamol 500 MG tablet Commonly known as: ROBAXIN Take 1 tablet (500 mg total) by mouth every 6 (six) hours as needed for muscle spasms.   ondansetron 4 MG tablet Commonly known as: ZOFRAN Take 1 tablet (4 mg total) by mouth every 6 (six) hours as needed for nausea.   oxyCODONE 5 MG  immediate release tablet Commonly known as: Oxy IR/ROXICODONE Take 1 tablet (5 mg total) by mouth every 4 (four) hours as needed for severe pain.  Durable Medical Equipment  (From admission, onward)         Start     Ordered   09/29/20 1534  For home use only DME Walker rolling  Once       Question Answer Comment  Walker: With 5 Inch Wheels   Patient needs a walker to treat with the following condition Tibia/fibula fracture      09/29/20 1533   09/29/20 1533  For home use only DME standard manual wheelchair with seat cushion  Once       Comments: Patient suffers from fracture and repair of left tib/fib fracture which impairs their ability to perform daily activities like ADLs:20651 in the home.  A walking TDS:28768 will not resolve issue with performing activities of daily living. A wheelchair will allow patient to safely perform daily activities. Patient can safely propel the wheelchair in the home or has a caregiver who can provide assistance. Length of need 6 months. Accessories: elevating leg rests (ELRs), wheel locks, extensions and anti-tippers.   09/29/20 1533   09/29/20 1532  For home use only DME Other see comment  Once       Comments: Needs rolling knee scooter - for fx and repair of Left tib/fib  Question:  Length of Need  Answer:  6 Months   09/29/20 1533          Follow-up Information    Haddix, Gillie Manners, MD. Schedule an appointment as soon as possible for a visit in 2 weeks.   Specialty: Orthopedic Surgery Why: For wound re-check, For suture removal, For repeat x-rays Contact information: [address] [place] [address] [phone]        [facility] Follow up.   Why: [facility] will deliver a rolling walker and wheelchair to your hospital room before you are discharged home.  The knee scooter can be picked up at [facility] location - [address], [place] - [phone]. Contact information: [address] [address] [phone]        [facility]. Schedule an appointment as soon as possible for a visit.   Why: Please call the above number and they will set you up with a primary care physician that is in network with your health insurance.  Please see a primary care provider in the next 7-10 days of your discharge home. Contact information: [phone]              Discharge Instructions and Plan: Patient will be discharged to home. Will be discharged on Aspirin 325 mg twice daily x 30 days for DVT prophylaxis. Patient has been provided with all the necessary DME for discharge. Patient will follow up with Dr. Jena Gauss in 2 weeks for repeat x-rays and splint/suture removal.   Signed:  Shawn Route. Ladonna Snide ?[phone]? (phone) 09/30/2020, 9:17 AM  [facility] [address] [place] [address] [phone] (F)

## 2020-09-30 NOTE — Progress Notes (Signed)
Physical Therapy Treatment Patient Details Name: Latasha Warren MRN: [id number] DOB: 12/17/1981 Today's Date: 09/30/2020    History of Present Illness Patient  is a 39 y/o female who slipped outside and fell backwards, unable to bear weight upon standing. X-rays showed a tib/fib fx and orthopedic surgery was consulted. No significant PMH. Patient underwent ORIF and IM of L tibia and is currently NWB.    PT Comments    Patient is progressing towards physical therapy goals. Patient expressed concern about stair negotiation when returning home. Instructed patient and husband on negotiating stairs backward with RW. Patient able to complete 2 x 2 stairs with RW and min guard. Patient continues to be limited by pain, decreased activity tolerance, decreased L LE strength, impaired balance, and WB restrictions. Continue to recommend OPPT following WB restriction change. PT will continue to follow.    Follow Up Recommendations  No PT follow up;Supervision for mobility/OOB (Recommend OPPT following WB restriction change)     Equipment Recommendations  Rolling Raelle Chambers with 5" wheels;Wheelchair (measurements PT)    Recommendations for Other Services       Precautions / Restrictions Precautions Precautions: Fall Restrictions Weight Bearing Restrictions: Yes LLE Weight Bearing: Non weight bearing    Mobility  Bed Mobility Overal bed mobility: Needs Assistance Bed Mobility: Supine to Sit     Supine to sit: Supervision;HOB elevated     General bed mobility comments: OOB in recliner upon entry  Transfers Overall transfer level: Needs assistance Equipment used: Rolling Macio Kissoon (2 wheeled) Transfers: Sit to/from Stand Sit to Stand: Min guard         General transfer comment: for safety, good technique   Ambulation/Gait Ambulation/Gait assistance: Min guard Gait Distance (Feet): 8 Feet Assistive device: Rolling Afrika Brick (2 wheeled) Gait Pattern/deviations: Step-to pattern          Stairs Stairs: Yes Stairs assistance: Min guard;+2 safety/equipment Stair Management: Backwards;With Sayra Frisby Number of Stairs: 4 (separated in bouts of 2) General stair comments: Patient had increased anxiety ascending stairs backwards, however with encouragement from PT and husband, pt completed stair negotiation.    Wheelchair Mobility    Modified Rankin (Stroke Patients Only)       Balance Overall balance assessment: Needs assistance Sitting-balance support: No upper extremity supported;Feet supported Sitting balance-Leahy Scale: Good     Standing balance support: Bilateral upper extremity supported;Single extremity supported;During functional activity Standing balance-Leahy Scale: Fair Standing balance comment: min guard for 1 handed techniques                             Cognition Arousal/Alertness: Awake/alert Behavior During Therapy: WFL for tasks assessed/performed Overall Cognitive Status: Within Functional Limits for tasks assessed                                        Exercises      General Comments General comments (skin integrity, edema, etc.): spouse present and supportive       Pertinent Vitals/Pain Pain Assessment: 0-10 Pain Score: 4  Faces Pain Scale: Hurts little more Pain Location: L lower leg Pain Descriptors / Indicators: Grimacing;Guarding Pain Intervention(s): Limited activity within patient's tolerance;Monitored during session;Repositioned    Home Living Family/patient expects to be discharged to:: Private residence Living Arrangements: Spouse/significant other Available Help at Discharge: Family Type of Home: House Home Access: Stairs to enter Entrance Stairs-Rails: Can reach  both Home Layout: One level Home Equipment: Grab bars - tub/shower;Hand held shower head;Shower seat      Prior Function Level of Independence: Independent      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   PT Goals (current goals can now  be found in the care plan section) Acute Rehab PT Goals Patient Stated Goal: to get back to work PT Goal Formulation: With patient Time For Goal Achievement: 10/13/20 Potential to Achieve Goals: Good Progress towards PT goals: Progressing toward goals    Frequency    Min 5X/week      PT Plan Current plan remains appropriate    Co-evaluation              AM-PAC PT "6 Clicks" Mobility   Outcome Measure  Help needed turning from your back to your side while in a flat bed without using bedrails?: None Help needed moving from lying on your back to sitting on the side of a flat bed without using bedrails?: None Help needed moving to and from a bed to a chair (including a wheelchair)?: A Little Help needed standing up from a chair using your arms (e.g., wheelchair or bedside chair)?: A Little Help needed to walk in hospital room?: A Little Help needed climbing 3-5 steps with a railing? : A Little 6 Click Score: 20    End of Session Equipment Utilized During Treatment: Gait belt Activity Tolerance: Patient tolerated treatment well Patient left: in chair;with call bell/phone within reach;with family/visitor present Nurse Communication: Mobility status PT Visit Diagnosis: Unsteadiness on feet (R26.81);Other abnormalities of gait and mobility (R26.89);Difficulty in walking, not elsewhere classified (R26.2);Pain Pain - Right/Left: Left Pain - part of body: Leg     Time: 6761-9509 PT Time Calculation (min) (ACUTE ONLY): 27 min  Charges:  $Therapeutic Activity: 23-37 mins                     Gregor Hams, PT, DPT Acute Rehabilitation Services Pager [phone] Office [phone]    Zannie Kehr Allred 09/30/2020, 10:38 AM

## 2021-08-28 IMAGING — DX DG TIBIA/FIBULA PORT 2V*L*
3 series · 3 of 3 positions shown · non-contrast
Comparison: 09/28/2020

CLINICAL DATA: Postop lower leg fractures

EXAM:
LEFT ANKLE - 2 VIEW; PORTABLE LEFT TIBIA AND FIBULA - 2 VIEW

[tibia ap (1 of 2)]
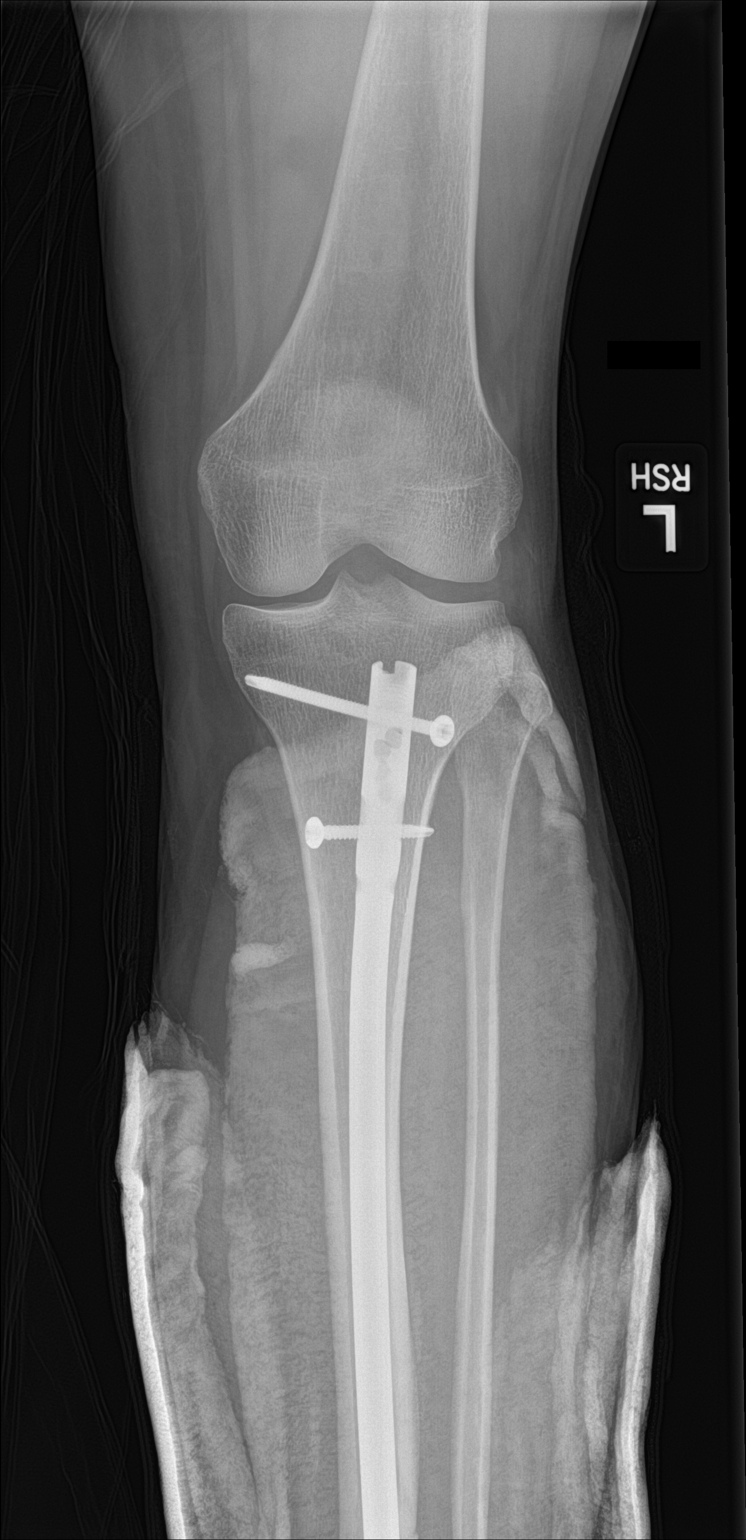

[tibia ap (2 of 2)]
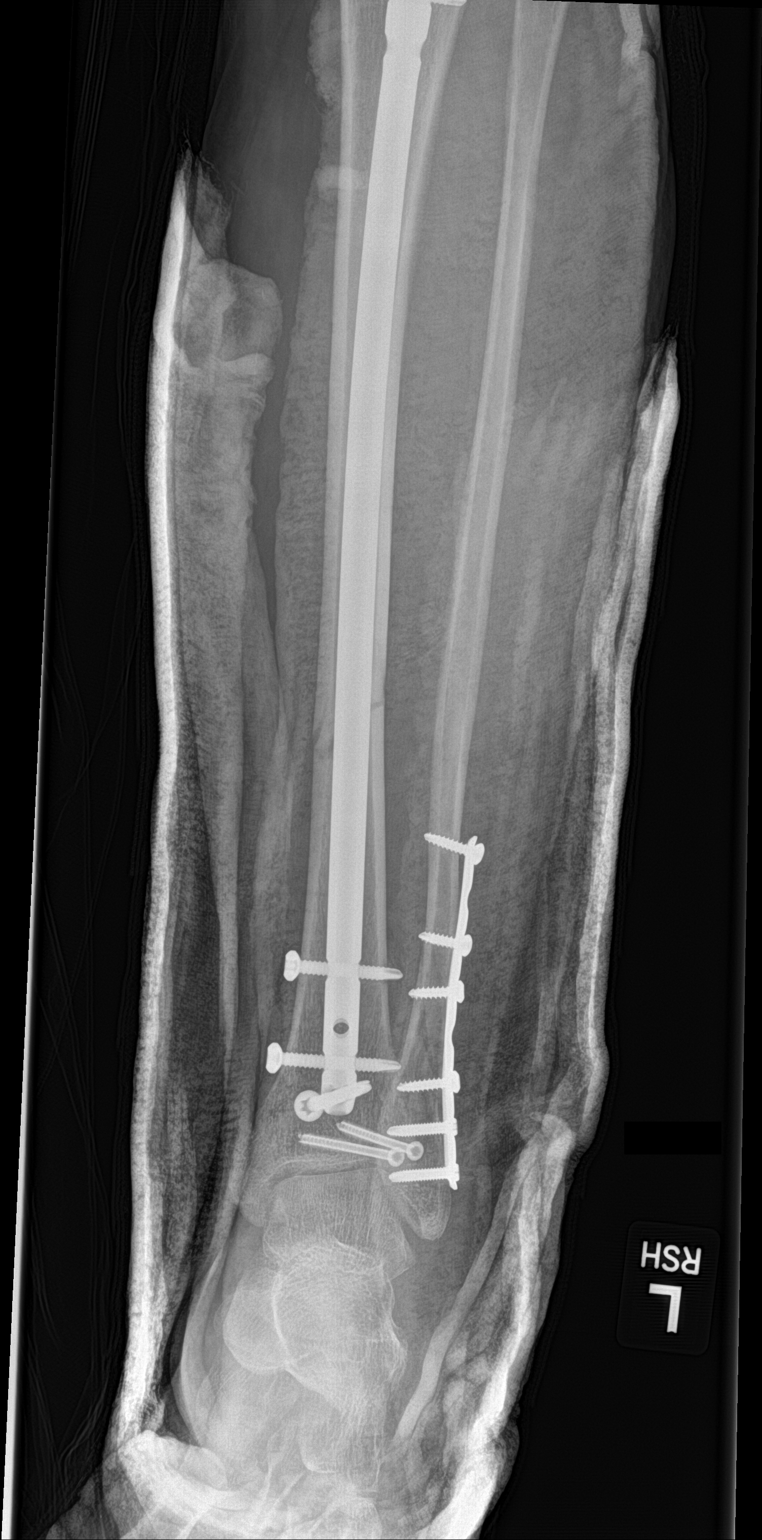

[tibia lat]
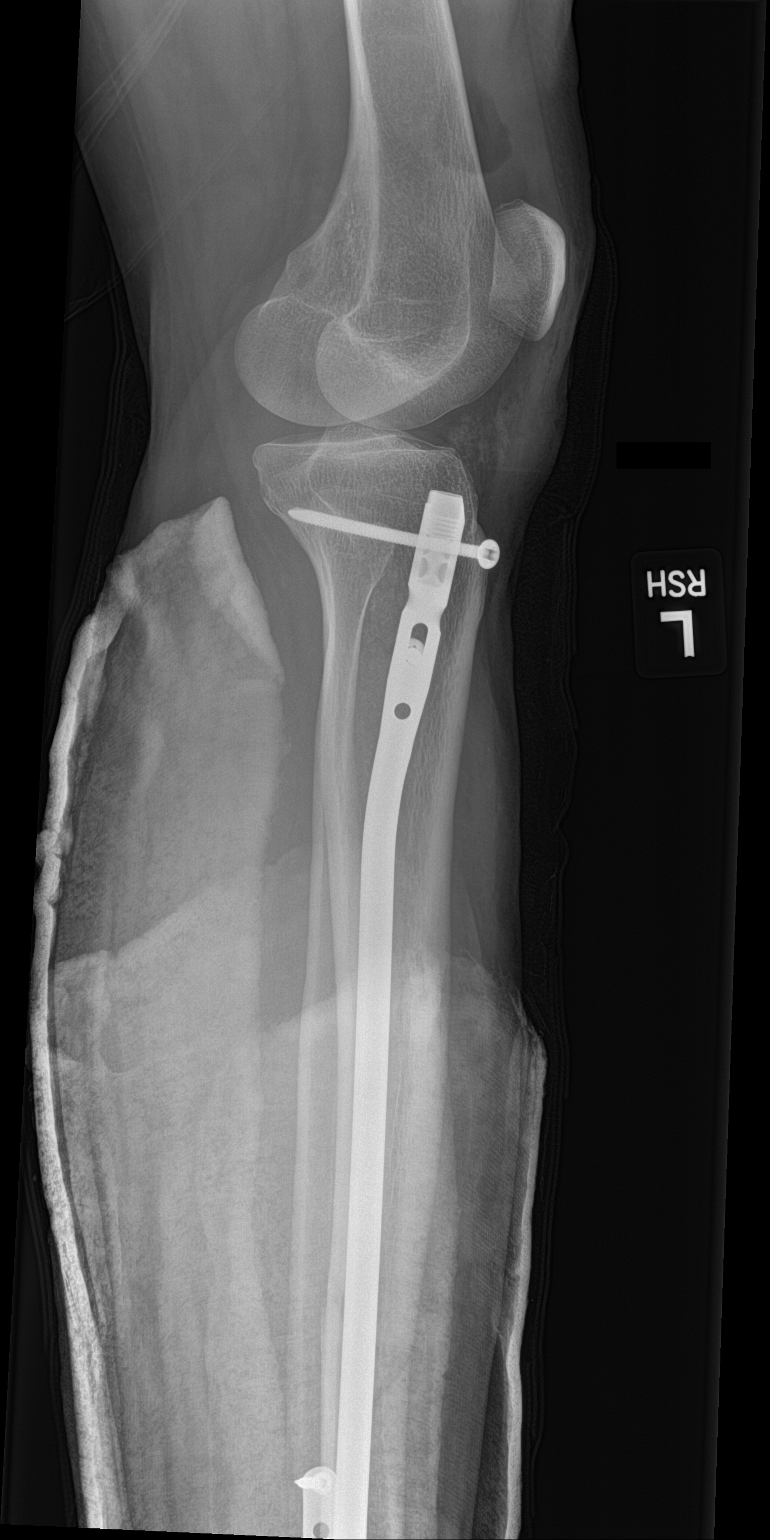

[3 of 3 positions shown; findings below may reference images not displayed]

FINDINGS: Tibial intramedullary nail with proximal and distal locking screws.
Well position with restoration of anatomic alignment of the tibia.
Lateral plate for reduction of distal fibular fracture. Anatomic
location here as well. Two additional screws present in the distal
tibia above the joint.
IMPRESSION: Good appearance following ORIF. No sign of hardware complication.
Anatomic alignment.
